# Patient Record
Sex: Female | Born: 1955 | Race: Black or African American | Hispanic: No | Marital: Married | Smoking: Never smoker
Health system: Southern US, Community
[De-identification: ages and names within clinical notes are randomized; demographics above are authoritative.]

## PROBLEM LIST (undated history)

## (undated) HISTORY — PX: OTHER SURGICAL HISTORY: SHX169

---

## 2014-05-28 ENCOUNTER — Emergency Department: Payer: Self-pay | Admitting: Emergency Medicine

## 2018-10-21 ENCOUNTER — Inpatient Hospital Stay
Admission: EM | Admit: 2018-10-21 | Discharge: 2018-10-24 | DRG: 872 | Disposition: A | Discharge status: Home / Self Care | Payer: MEDICAID | Attending: Internal Medicine | Admitting: Internal Medicine

## 2018-10-21 ENCOUNTER — Other Ambulatory Visit: Payer: Self-pay

## 2018-10-21 ENCOUNTER — Emergency Department: Payer: MEDICAID

## 2018-10-21 ENCOUNTER — Encounter: Payer: Self-pay | Admitting: Emergency Medicine

## 2018-10-21 DIAGNOSIS — E876 Hypokalemia: Secondary | ICD-10-CM | POA: Diagnosis present

## 2018-10-21 DIAGNOSIS — L0390 Cellulitis, unspecified: Secondary | ICD-10-CM | POA: Diagnosis present

## 2018-10-21 DIAGNOSIS — A419 Sepsis, unspecified organism: Principal | ICD-10-CM | POA: Diagnosis present

## 2018-10-21 DIAGNOSIS — Z20828 Contact with and (suspected) exposure to other viral communicable diseases: Secondary | ICD-10-CM | POA: Diagnosis present

## 2018-10-21 DIAGNOSIS — L03116 Cellulitis of left lower limb: Secondary | ICD-10-CM | POA: Diagnosis present

## 2018-10-21 DIAGNOSIS — E871 Hypo-osmolality and hyponatremia: Secondary | ICD-10-CM | POA: Diagnosis present

## 2018-10-21 DIAGNOSIS — R7301 Impaired fasting glucose: Secondary | ICD-10-CM | POA: Diagnosis present

## 2018-10-21 LAB — COMPREHENSIVE METABOLIC PANEL
ALT: 19 U/L (ref 0–44)
AST: 39 U/L (ref 15–41)
Albumin: 3.8 g/dL (ref 3.5–5.0)
Alkaline Phosphatase: 66 U/L (ref 38–126)
Anion gap: 10 (ref 5–15)
BUN: 14 mg/dL (ref 8–23)
CO2: 25 mmol/L (ref 22–32)
Calcium: 8.8 mg/dL — ABNORMAL LOW (ref 8.9–10.3)
Chloride: 99 mmol/L (ref 98–111)
Creatinine, Ser: 0.94 mg/dL (ref 0.44–1.00)
GFR calc Af Amer: >60 mL/min (ref >60)
GFR calc non Af Amer: >60 mL/min (ref >60)
Glucose, Bld: 144 mg/dL — ABNORMAL HIGH (ref 70–99)
Potassium: 3.3 mmol/L — ABNORMAL LOW (ref 3.5–5.1)
Sodium: 134 mmol/L — ABNORMAL LOW (ref 135–145)
Total Bilirubin: 0.7 mg/dL (ref 0.3–1.2)
Total Protein: 7.6 g/dL (ref 6.5–8.1)

## 2018-10-21 LAB — PROTIME-INR
INR: 1.3 — ABNORMAL HIGH (ref 0.8–1.2)
Prothrombin Time: 15.9 seconds — ABNORMAL HIGH (ref 11.4–15.2)

## 2018-10-21 LAB — CBC WITH DIFFERENTIAL/PLATELET
Abs Immature Granulocytes: 0.15 10*3/uL — ABNORMAL HIGH (ref 0.00–0.07)
Basophils Absolute: 0.1 10*3/uL (ref 0.0–0.1)
Basophils Relative: 1 %
Eosinophils Absolute: 0 10*3/uL (ref 0.0–0.5)
Eosinophils Relative: 0 %
HCT: 37.3 % (ref 36.0–46.0)
Hemoglobin: 12.5 g/dL (ref 12.0–15.0)
Immature Granulocytes: 1 %
Lymphocytes Relative: 6 %
Lymphs Abs: 1.2 10*3/uL (ref 0.7–4.0)
MCH: 30.6 pg (ref 26.0–34.0)
MCHC: 33.5 g/dL (ref 30.0–36.0)
MCV: 91.4 fL (ref 80.0–100.0)
Monocytes Absolute: 0.8 10*3/uL (ref 0.1–1.0)
Monocytes Relative: 4 %
Neutro Abs: 18.9 10*3/uL — ABNORMAL HIGH (ref 1.7–7.7)
Neutrophils Relative %: 88 %
Platelets: 202 10*3/uL (ref 150–400)
RBC: 4.08 MIL/uL (ref 3.87–5.11)
RDW: 13.2 % (ref 11.5–15.5)
WBC: 21.1 10*3/uL — ABNORMAL HIGH (ref 4.0–10.5)
nRBC: 0 % (ref 0.0–0.2)

## 2018-10-21 LAB — LACTIC ACID, PLASMA: Lactic Acid, Venous: 1.9 mmol/L (ref 0.5–1.9)

## 2018-10-21 MED ORDER — ENOXAPARIN SODIUM 40 MG/0.4ML ~~LOC~~ SOLN
40.0000 mg | SUBCUTANEOUS | Status: DC
  Administered 2018-10-21 – 2018-10-23 (×3): 40 mg via SUBCUTANEOUS
  Filled 2018-10-21 (×3): qty 0.4

## 2018-10-21 MED ORDER — ACETAMINOPHEN 500 MG PO TABS
1000.0000 mg | ORAL_TABLET | Freq: Once | ORAL | Status: AC
  Administered 2018-10-21: 1000 mg via ORAL
  Filled 2018-10-21: qty 2

## 2018-10-21 MED ORDER — POTASSIUM CHLORIDE CRYS ER 20 MEQ PO TBCR
40.0000 meq | EXTENDED_RELEASE_TABLET | Freq: Once | ORAL | Status: AC
  Administered 2018-10-21: 40 meq via ORAL
  Filled 2018-10-21: qty 2

## 2018-10-21 MED ORDER — ONDANSETRON HCL 4 MG PO TABS: 4.0000 mg | ORAL_TABLET | Freq: Four times a day (QID) | ORAL | Status: DC | PRN

## 2018-10-21 MED ORDER — LACTATED RINGERS IV BOLUS
1000.0000 mL | Freq: Once | INTRAVENOUS | Status: AC
  Administered 2018-10-21: 19:00:00 1000 mL via INTRAVENOUS

## 2018-10-21 MED ORDER — SODIUM CHLORIDE 0.9 % IV SOLN
INTRAVENOUS | Status: DC
  Administered 2018-10-21: 22:00:00 via INTRAVENOUS

## 2018-10-21 MED ORDER — SENNOSIDES-DOCUSATE SODIUM 8.6-50 MG PO TABS: 1.0000 | ORAL_TABLET | Freq: Every evening | ORAL | Status: DC | PRN

## 2018-10-21 MED ORDER — ACETAMINOPHEN 325 MG PO TABS
650.0000 mg | ORAL_TABLET | Freq: Four times a day (QID) | ORAL | Status: DC | PRN
  Administered 2018-10-22: 04:00:00 650 mg via ORAL
  Filled 2018-10-21: qty 2

## 2018-10-21 MED ORDER — PNEUMOCOCCAL VAC POLYVALENT 25 MCG/0.5ML IJ INJ: 0.5000 mL | INJECTION | INTRAMUSCULAR | Status: DC

## 2018-10-21 MED ORDER — VANCOMYCIN HCL 10 G IV SOLR
1250.0000 mg | INTRAVENOUS | Status: DC
  Administered 2018-10-21 – 2018-10-22 (×2): 1250 mg via INTRAVENOUS
  Filled 2018-10-21 (×3): qty 1250

## 2018-10-21 MED ORDER — SODIUM CHLORIDE 0.9 % IV SOLN
2.0000 g | Freq: Three times a day (TID) | INTRAVENOUS | Status: DC
  Administered 2018-10-21 – 2018-10-24 (×8): 2 g via INTRAVENOUS
  Filled 2018-10-21 (×9): qty 2

## 2018-10-21 MED ORDER — HYDROCODONE-ACETAMINOPHEN 5-325 MG PO TABS: 1.0000 | ORAL_TABLET | ORAL | Status: DC | PRN

## 2018-10-21 MED ORDER — ACETAMINOPHEN 650 MG RE SUPP: 650.0000 mg | Freq: Four times a day (QID) | RECTAL | Status: DC | PRN

## 2018-10-21 MED ORDER — ONDANSETRON HCL 4 MG/2ML IJ SOLN: 4.0000 mg | Freq: Four times a day (QID) | INTRAMUSCULAR | Status: DC | PRN

## 2018-10-21 MED ORDER — VANCOMYCIN HCL IN DEXTROSE 1-5 GM/200ML-% IV SOLN
1000.0000 mg | Freq: Once | INTRAVENOUS | Status: AC
  Administered 2018-10-21: 1000 mg via INTRAVENOUS
  Filled 2018-10-21: qty 200

## 2018-10-21 MED ORDER — SODIUM CHLORIDE 0.9 % IV SOLN
2.0000 g | Freq: Once | INTRAVENOUS | Status: AC
  Administered 2018-10-21: 2 g via INTRAVENOUS
  Filled 2018-10-21: qty 20

## 2018-10-21 NOTE — ED Provider Notes (Addendum)
[facility] Emergency Department Provider Note   ____________________________________________   First MD Initiated Contact with Patient 10/21/18 1635     (approximate)  I have reviewed the triage vital signs and the nursing notes.   HISTORY  Chief Complaint Leg Swelling    HPI Marissa Hayes is a 63 y.o. female with no significant past medical history who presents to the ED complaining of leg swelling and pain.  Patient reports that yesterday she developed a twinge of pain shooting down her left leg.  She then began to notice that it was increasingly red and swollen from about her knee down, and very tender to touch.  She began to feel hot and noticed a fever overnight.  She denies any injuries to her left leg, has not noticed any scratches or cuts.  She denies any history of DVT or PE, has not had any chest pain or shortness of breath.  She denies any history of diabetes and has not had issues with skin infections in the past.  She has not had any cough, chest pain, or shortness of breath.        History reviewed. No pertinent past medical history.  There are no active problems to display for this patient.   History reviewed. No pertinent surgical history.  Prior to Admission medications   Not on File    Allergies Patient has no known allergies.  History reviewed. No pertinent family history.  Social History Social History   Tobacco Use  . Smoking status: Never Smoker  . Smokeless tobacco: Never Used  Substance Use Topics  . Alcohol use: Never    Frequency: Never  . Drug use: Never    Review of Systems  Constitutional: Positive for fevers. Eyes: No visual changes. ENT: No sore throat. Cardiovascular: Denies chest pain. Respiratory: Denies shortness of breath. Gastrointestinal: No abdominal pain.  No nausea, no vomiting.  No diarrhea.  No constipation. Genitourinary: Negative for dysuria. Musculoskeletal: Negative for back pain.   Positive for left lower extremity pain and swelling Skin: Positive for red rash to left lower extremity. Neurological: Negative for headaches, focal weakness or numbness.  ____________________________________________   PHYSICAL EXAM:  VITAL SIGNS: ED Triage Vitals  Enc Vitals Group     BP 10/21/18 1616 (!) 143/58     Pulse Rate 10/21/18 1616 97     Resp --      Temp 10/21/18 1616 (!) 101.9 F (38.8 C)     Temp Source 10/21/18 1616 Oral     SpO2 10/21/18 1616 96 %     Weight 10/21/18 1611 199 lb (90.3 kg)     Height 10/21/18 1611 5\' 6"  (1.676 m)     Head Circumference --      Peak Flow --      Pain Score 10/21/18 1611 9     Pain Loc --      Pain Edu? --      Excl. in GC? --     Constitutional: Alert and oriented. Eyes: Conjunctivae are normal. Head: Atraumatic. Nose: No congestion/rhinnorhea. Mouth/Throat: Mucous membranes are moist. Neck: Normal ROM Cardiovascular: Normal rate, regular rhythm. Grossly normal heart sounds.  2+ DP pulses bilaterally. Respiratory: Normal respiratory effort.  No retractions. Lungs CTAB. Gastrointestinal: Soft and nontender. No distention. Genitourinary: deferred Musculoskeletal: Edema, erythema, and warmth extending distally from left knee with diffuse tenderness to light palpation. Neurologic:  Normal speech and language. No gross focal neurologic deficits are appreciated. Skin:  Skin  is warm, dry and intact.  Erythema throughout left lower extremity, no apparent wounds. Psychiatric: Mood and affect are normal. Speech and behavior are normal.  ____________________________________________   LABS (all labs ordered are listed, but only abnormal results are displayed)  Labs Reviewed  COMPREHENSIVE METABOLIC PANEL - Abnormal; Notable for the following components:      Result Value   Sodium 134 (*)    Potassium 3.3 (*)    Glucose, Bld 144 (*)    Calcium 8.8 (*)    All other components within normal limits  CBC WITH  DIFFERENTIAL/PLATELET - Abnormal; Notable for the following components:   WBC 21.1 (*)    Neutro Abs 18.9 (*)    Abs Immature Granulocytes 0.15 (*)    All other components within normal limits  PROTIME-INR - Abnormal; Notable for the following components:   Prothrombin Time 15.9 (*)    INR 1.3 (*)    All other components within normal limits  CULTURE, BLOOD (ROUTINE X 2)  CULTURE, BLOOD (ROUTINE X 2)  URINE CULTURE  SARS CORONAVIRUS 2  LACTIC ACID, PLASMA  URINALYSIS, ROUTINE W REFLEX MICROSCOPIC   ____________________________________________  EKG  ED ECG REPORT I, Blake Divine, the attending physician, personally viewed and interpreted this ECG.   Date: 10/21/2018  EKG Time: 17:51  Rate: 80  Rhythm: normal EKG, normal sinus rhythm, unchanged from previous tracings  Axis: Normal  Intervals:none  ST&T Change: None    PROCEDURES  Procedure(s) performed (including Critical Care):  .Critical Care Performed by: Blake Divine, MD Authorized by: Blake Divine, MD   Critical care provider statement:    Critical care time (minutes):  35   Critical care time was exclusive of:  Separately billable procedures and treating other patients and teaching time   Critical care was necessary to treat or prevent imminent or life-threatening deterioration of the following conditions:  Sepsis   Critical care was time spent personally by me on the following activities:  Discussions with consultants, evaluation of patient's response to treatment, examination of patient, ordering and performing treatments and interventions, ordering and review of laboratory studies, ordering and review of radiographic studies, pulse oximetry, re-evaluation of patient's condition, obtaining history from patient or surrogate and review of old charts   I assumed direction of critical care for this patient from another provider in my specialty: no       ____________________________________________    INITIAL IMPRESSION / ASSESSMENT AND PLAN / ED COURSE       63 year old female without significant past medical history presents to the ED complaining of increasing left lower extremity pain, swelling, and redness since yesterday.  Noted to be febrile here with no other infectious symptoms.  Differential includes sepsis, cellulitis, abscess, necrotizing fasciitis, pneumonia, UTI, DVT.  Left lower extremity swollen, erythematous, warm, and tender to palpation.  Appears most consistent with cellulitis, ultrasound negative for DVT.  Patient meets sepsis criteria given tachycardia, fever, and leukocytosis.  Will treat with Rocephin and vancomycin, fluid resuscitation initiated.  Case discussed with hospitalist, who accepts patient for admission.      ____________________________________________   FINAL CLINICAL IMPRESSION(S) / ED DIAGNOSES  Final diagnoses:  Sepsis without acute organ dysfunction, due to unspecified organism [facility]  Cellulitis of left lower extremity     ED Discharge Orders    None       Note:  This document was prepared using Dragon voice recognition software and may include unintentional dictation errors.   Blake Divine, MD 10/21/18 [phone]  Chesley NoonJessup, Clyde Upshaw, MD 11/03/18 1026

## 2018-10-21 NOTE — Consult Note (Signed)
Pharmacy Antibiotic Note  Marissa Hayes is a 63 y.o. female admitted on 10/21/2018 with sepsis.  Pharmacy has been consulted for cefepime and vancomycin dosing.  Plan: Cefepime 2 g q8H   Pt received vancomycin 1000 mg x 1 in the ED. Will give an addition vancomycin 1250 mg x 1 for a total loading dose of 2250 mg. Will order a maintenance dose of 1250 mg q24H. Predicted AUC is 545. Goal AUC is 400-550. Scr 0.94. Css min 12.5. Plan to order levels in 4-5 days.   Height: 5\' 6"  (167.6 cm) Weight: 199 lb (90.3 kg) IBW/kg (Calculated) : 59.3  Temp (24hrs), Avg:101.9 F (38.8 C), Min:101.9 F (38.8 C), Max:101.9 F (38.8 C)  Recent Labs  Lab 10/21/18 1615 10/21/18 1621  WBC  --  21.1*  CREATININE  --  0.94  LATICACIDVEN 1.9  --     Estimated Creatinine Clearance: 67.5 mL/min (by C-G formula based on SCr of 0.94 mg/dL).    No Known Allergies  Antimicrobials this admission: 8/5 cefepime >>  8/5 vancomycin >>   Dose adjustments this admission: None  Microbiology results: 8/5 BCx: pending   Thank you for allowing pharmacy to be a part of this patient's care.  Oswald Hillock 10/21/2018 7:19 PM

## 2018-10-21 NOTE — ED Notes (Signed)
ED TO INPATIENT HANDOFF REPORT  ED Nurse Name and Phone #:  [phone] Ronni RumbleAlicia  S Name/Age/Gender Marissa Hayes 63 y.o. female Room/Bed: ED13A/ED13A  Code Status   Code Status: Not on file  Home/SNF/Other Home Patient oriented to: self, place, time and situation Is this baseline? Yes   Triage Complete: Triage complete  Chief Complaint leg pain  Triage Note Pt here for LLE pain/swelling/redness.  She reports it started yesterday. Leg is hot to touch.  Appears like a cellulitis.  Unlabored. Redness and swelling go from foot to knee.  Febrile in triage.     Allergies No Known Allergies  Level of Care/Admitting Diagnosis ED Disposition    ED Disposition Condition Comment   Admit  [facility]ospital [facility]: [facility] [[id number]]  Level of Care: Med-Surg [16]  Covid Evaluation: Asymptomatic Screening Protocol (No Symptoms)  Diagnosis: Cellulitis [[id number]][[id number]]  Admitting Physician: Ihor AustinPYREDDY, PAVAN [[id number]]  Attending Physician: Ihor AustinPYREDDY, PAVAN [[id number]]  Estimated length of stay: past midnight tomorrow  Certification:: I certify this patient will need inpatient services for at least 2 midnights  PT Class (Do Not Modify): Inpatient [101]  PT Acc Code (Do Not Modify): Private [1]       B Medical/Surgery History History reviewed. No pertinent past medical history. Past Surgical History:  Procedure Laterality Date  . none       A IV Location/Drains/Wounds Patient Lines/Drains/Airways Status   Active Line/Drains/Airways    Name:   Placement date:   Placement time:   Site:   Days:   Peripheral IV 10/21/18 Right Antecubital   10/21/18    1615    Antecubital   less than 1          Intake/Output Last 24 hours  Intake/Output Summary (Last 24 hours) at 10/21/2018 2011 Last data filed at 10/21/2018 1936 Gross per 24 hour  Intake 200 ml  Output -  Net 200 ml    Labs/Imaging Results for orders placed or performed during the hospital encounter of 10/21/18 (from the past 48  hour(s))  Lactic acid, plasma     Status: None   Collection Time: 10/21/18  4:15 PM  Result Value Ref Range   Lactic Acid, Venous 1.9 0.5 - 1.9 mmol/L    Comment: Performed at [facility], [address] [place], [place] [address]  Comprehensive metabolic panel     Status: Abnormal   Collection Time: 10/21/18  4:21 PM  Result Value Ref Range   Sodium 134 (L) 135 - 145 mmol/L   Potassium 3.3 (L) 3.5 - 5.1 mmol/L   Chloride 99 98 - 111 mmol/L   CO2 25 22 - 32 mmol/L   Glucose, Bld 144 (H) 70 - 99 mg/dL   BUN 14 8 - 23 mg/dL   Creatinine, Ser 0.860.94 0.44 - 1.00 mg/dL   Calcium 8.8 (L) 8.9 - 10.3 mg/dL   Total Protein 7.6 6.5 - 8.1 g/dL   Albumin 3.8 3.5 - 5.0 g/dL   AST 39 15 - 41 U/L   ALT 19 0 - 44 U/L   Alkaline Phosphatase 66 38 - 126 U/L   Total Bilirubin 0.7 0.3 - 1.2 mg/dL   GFR calc non Af Amer >60 >60 mL/min   GFR calc Af Amer >60 >60 mL/min   Anion gap 10 5 - 15    Comment: Performed at [facility], [address] [place], [place] [address]  CBC with Differential     Status: Abnormal   Collection  Time: 10/21/18  4:21 PM  Result Value Ref Range   WBC 21.1 (H) 4.0 - 10.5 K/uL   RBC 4.08 3.87 - 5.11 MIL/uL   Hemoglobin 12.5 12.0 - 15.0 g/dL   HCT 57.837.3 46.936.0 - 62.946.0 %   MCV 91.4 80.0 - 100.0 fL   MCH 30.6 26.0 - 34.0 pg   MCHC 33.5 30.0 - 36.0 g/dL   RDW 52.813.2 41.311.5 - 24.415.5 %   Platelets 202 150 - 400 K/uL   nRBC 0.0 0.0 - 0.2 %   Neutrophils Relative % 88 %   Neutro Abs 18.9 (H) 1.7 - 7.7 K/uL   Lymphocytes Relative 6 %   Lymphs Abs 1.2 0.7 - 4.0 K/uL   Monocytes Relative 4 %   Monocytes Absolute 0.8 0.1 - 1.0 K/uL   Eosinophils Relative 0 %   Eosinophils Absolute 0.0 0.0 - 0.5 K/uL   Basophils Relative 1 %   Basophils Absolute 0.1 0.0 - 0.1 K/uL   Immature Granulocytes 1 %   Abs Immature Granulocytes 0.15 (H) 0.00 - 0.07 K/uL    Comment: Performed at [facility], [address] [place], [place] [address]  Protime-INR     Status:  Abnormal   Collection Time: 10/21/18  4:21 PM  Result Value Ref Range   Prothrombin Time 15.9 (H) 11.4 - 15.2 seconds   INR 1.3 (H) 0.8 - 1.2    Comment: (NOTE) INR goal varies based on device and disease states. Performed at [facility], [address] [place], [place] [address]    [place] Venous Img Lower Unilateral Left  Result Date: 10/21/2018 CLINICAL DATA:  Left lower extremity pain and edema. EXAM: LEFT LOWER EXTREMITY VENOUS DOPPLER ULTRASOUND TECHNIQUE: Gray-scale sonography with graded compression, as well as color Doppler and duplex ultrasound were performed to evaluate the lower extremity deep venous systems from the level of the common femoral vein and including the common femoral, femoral, profunda femoral, popliteal and calf veins including the posterior tibial, peroneal and gastrocnemius veins when visible. The superficial great saphenous vein was also interrogated. Spectral Doppler was utilized to evaluate flow at rest and with distal augmentation maneuvers in the common femoral, femoral and popliteal veins. COMPARISON:  None. FINDINGS: Contralateral Common Femoral Vein: Respiratory phasicity is normal and symmetric with the symptomatic side. No evidence of thrombus. Normal compressibility. Common Femoral Vein: No evidence of thrombus. Normal compressibility, respiratory phasicity and response to augmentation. Saphenofemoral Junction: No evidence of thrombus. Normal compressibility and flow on color Doppler imaging. Profunda Femoral Vein: No evidence of thrombus. Normal compressibility and flow on color Doppler imaging. Femoral Vein: No evidence of thrombus. Normal compressibility, respiratory phasicity and response to augmentation. Popliteal Vein: No evidence of thrombus. Normal compressibility, respiratory phasicity and response to augmentation. Calf Veins: No evidence of thrombus. Normal compressibility and flow on color Doppler imaging. Superficial Great Saphenous Vein: No  evidence of thrombus. Normal compressibility. Venous Reflux:  None. Other Findings: Small fluid collection in the left popliteal fossa measures approximately 4.5 x 1.1 x 2.5 cm and is consistent with a Baker's cyst. No evidence of superficial thrombophlebitis. IMPRESSION: No evidence of left lower extremity deep venous thrombosis. Left popliteal fossa Baker's cyst present. Electronically Signed   By: Irish LackGlenn  Yamagata M.D.   On: 10/21/2018 18:59    Pending Labs Unresulted Labs (From admission, onward)    Start     Ordered   10/21/18 1805  SARS CORONAVIRUS 2 Nasal Swab Aptima Multi Swab  (Asymptomatic/Tier 2  Patients Labs)  Once,   STAT    Question Answer Comment  Is this test for diagnosis or screening Screening   Symptomatic for COVID-19 as defined by CDC No   Hospitalized for COVID-19 No   Admitted to ICU for COVID-19 No   Previously tested for COVID-19 No   Resident in a congregate (group) care setting No   Employed in healthcare setting No   Pregnant No      10/21/18 1805   10/21/18 1702  Urinalysis, Routine w reflex microscopic  ONCE - STAT,   STAT     10/21/18 1703   10/21/18 1702  Urine culture  ONCE - STAT,   STAT     10/21/18 1703   10/21/18 1619  Culture, blood (Routine x 2)  BLOOD CULTURE X 2,   STAT     10/21/18 1618   Signed and Held  HIV antibody (Routine Testing)  Once,   R     Signed and Held   Signed and Held  CBC  (enoxaparin (LOVENOX)    CrCl >/= 30 ml/min)  Once,   R    Comments: Baseline for enoxaparin therapy IF NOT ALREADY DRAWN.  Notify MD if PLT < 100 K.    Signed and Held   Signed and Held  Creatinine, serum  (enoxaparin (LOVENOX)    CrCl >/= 30 ml/min)  Once,   R    Comments: Baseline for enoxaparin therapy IF NOT ALREADY DRAWN.    Signed and Held   Signed and Held  Creatinine, serum  (enoxaparin (LOVENOX)    CrCl >/= 30 ml/min)  Weekly,   R    Comments: while on enoxaparin therapy    Signed and Held   Signed and Held  Basic metabolic panel  Tomorrow  morning,   R     Signed and Held   Signed and Held  CBC  Tomorrow morning,   R     Signed and Held          Vitals/Pain Today's Vitals   10/21/18 1930 10/21/18 2000 10/21/18 2010 10/21/18 2010  BP: 129/61 (!) 120/52    Pulse: 80 80    Resp: (!) 23 (!) 24    Temp:   99.4 F (37.4 C)   TempSrc:   Oral   SpO2: 96% 95%    Weight:      Height:      PainSc:    0-No pain    Isolation Precautions No active isolations  Medications Medications  potassium chloride SA (K-DUR) CR tablet 40 mEq (has no administration in time range)  vancomycin (VANCOCIN) 1,250 mg in sodium chloride 0.9 % 250 mL IVPB (has no administration in time range)  ceFEPIme (MAXIPIME) 2 g in sodium chloride 0.9 % 100 mL IVPB (has no administration in time range)  vancomycin (VANCOCIN) IVPB 1000 mg/200 mL premix (0 mg Intravenous Stopped 10/21/18 1936)  cefTRIAXone (ROCEPHIN) 2 g in sodium chloride 0.9 % 100 mL IVPB (0 g Intravenous Stopped 10/21/18 1822)  lactated ringers bolus 1,000 mL (1,000 mLs Intravenous New Bag/Given 10/21/18 1836)  acetaminophen (TYLENOL) tablet 1,000 mg (1,000 mg Oral Given 10/21/18 1745)    Mobility walks Low fall risk   Focused Assessments Cardiac Assessment Handoff:    No results found for: CKTOTAL, CKMB, CKMBINDEX, TROPONINI No results found for: DDIMER Does the Patient currently have chest pain? No   , Pulmonary Assessment Handoff:  Lung sounds:   clear bil O2 Device: Room Air  Redness, swelling, warmth noted to LLE.      R Recommendations: See Admitting Provider Note  Report given to:   Additional Notes:

## 2018-10-21 NOTE — H&P (Signed)
[facility] [place] NAME: Virginia Curl    MR#:  [id number]  DATE OF BIRTH:  [birth year]  DATE OF ADMISSION:  10/21/2018  PRIMARY CARE PHYSICIAN: Patient, No Pcp Per   REQUESTING/REFERRING PHYSICIAN:   CHIEF COMPLAINT:   Chief Complaint  Patient presents with  . Leg Swelling    HISTORY OF PRESENT ILLNESS: Brindle Leyba  is a 63 y.o. female with no significant past medical history presented to the emergency room with left leg swelling and redness for the last 1 day.  There is pain in the left leg.  Patient also some itching and redness in the left leg.  Swelling of the left leg also noted.  Venous Doppler ultrasound did not reveal any DVT.  Laboratory work-up showed elevated WBC count patient has cellulitis of the left leg and antibiotics were started.  PAST MEDICAL HISTORY: No history of COPD diabetes and heart disease  PAST SURGICAL HISTORY:  Past Surgical History:  Procedure Laterality Date  . none      SOCIAL HISTORY:  Social History   Tobacco Use  . Smoking status: Never Smoker  . Smokeless tobacco: Never Used  Substance Use Topics  . Alcohol use: Never    Frequency: Never    FAMILY HISTORY: No history of COPD diabetes cancer in the family  DRUG ALLERGIES: No Known Allergies  REVIEW OF SYSTEMS:   CONSTITUTIONAL: No fever, fatigue or weakness.  EYES: No blurred or double vision.  EARS, NOSE, AND THROAT: No tinnitus or ear pain.  RESPIRATORY: No cough, shortness of breath, wheezing or hemoptysis.  CARDIOVASCULAR: No chest pain, orthopnea, edema.  GASTROINTESTINAL: No nausea, vomiting, diarrhea or abdominal pain.  GENITOURINARY: No dysuria, hematuria.  ENDOCRINE: No polyuria, nocturia,  HEMATOLOGY: No anemia, easy bruising or bleeding SKIN : Red discoloration of the skin of the left leg MUSCULOSKELETAL: Swelling of the left leg Redness of the left leg NEUROLOGIC: No tingling, numbness, weakness.  PSYCHIATRY: No anxiety  or depression.   MEDICATIONS AT HOME:  Prior to Admission medications   Not on File      PHYSICAL EXAMINATION:   VITAL SIGNS: Blood pressure (!) 120/97, pulse 78, temperature (!) 101.9 F (38.8 C), temperature source Oral, resp. rate (!) 21, height 5\' 6"  (1.676 m), weight 90.3 kg, SpO2 97 %.  GENERAL:  63 y.o.-year-old patient lying in the bed with no acute distress.  EYES: Pupils equal, round, reactive to light and accommodation. No scleral icterus. Extraocular muscles intact.  HEENT: Head atraumatic, normocephalic. Oropharynx and nasopharynx clear.  NECK:  Supple, no jugular venous distention. No thyroid enlargement, no tenderness.  LUNGS: Normal breath sounds bilaterally, no wheezing, rales,rhonchi or crepitation. No use of accessory muscles of respiration.  CARDIOVASCULAR: S1, S2 normal. No murmurs, rubs, or gallops.  ABDOMEN: Soft, nontender, nondistended. Bowel sounds present. No organomegaly or mass.  EXTREMITIES: No  cyanosis, or clubbing.  Swelling of the left leg noted NEUROLOGIC: Cranial nerves II through XII are intact. Muscle strength 5/5 in all extremities. Sensation intact. Gait not checked.  PSYCHIATRIC: The patient is alert and oriented x 3.  SKIN: Red discoloration of the skin of the left leg      LABORATORY PANEL:   CBC Recent Labs  Lab 10/21/18 1621  WBC 21.1*  HGB 12.5  HCT 37.3  PLT 202  MCV 91.4  MCH 30.6  MCHC 33.5  RDW 13.2  LYMPHSABS 1.2  MONOABS 0.8  EOSABS 0.0  BASOSABS 0.1   ------------------------------------------------------------------------------------------------------------------  Chemistries  Recent Labs  Lab 10/21/18 1621  NA 134*  K 3.3*  CL 99  CO2 25  GLUCOSE 144*  BUN 14  CREATININE 0.94  CALCIUM 8.8*  AST 39  ALT 19  ALKPHOS 66  BILITOT 0.7   ------------------------------------------------------------------------------------------------------------------ estimated creatinine clearance is 67.5 mL/min (by  C-G formula based on SCr of 0.94 mg/dL). ------------------------------------------------------------------------------------------------------------------ No results for input(s): TSH, T4TOTAL, T3FREE, THYROIDAB in the last 72 hours.  Invalid input(s): FREET3   Coagulation profile Recent Labs  Lab 10/21/18 1621  INR 1.3*   ------------------------------------------------------------------------------------------------------------------- No results for input(s): DDIMER in the last 72 hours. -------------------------------------------------------------------------------------------------------------------  Cardiac Enzymes No results for input(s): CKMB, TROPONINI, MYOGLOBIN in the last 168 hours.  Invalid input(s): CK ------------------------------------------------------------------------------------------------------------------ Invalid input(s): POCBNP  ---------------------------------------------------------------------------------------------------------------  Urinalysis No results found for: COLORURINE, APPEARANCEUR, LABSPEC, PHURINE, GLUCOSEU, HGBUR, BILIRUBINUR, KETONESUR, PROTEINUR, UROBILINOGEN, NITRITE, LEUKOCYTESUR   RADIOLOGY: Koreas Venous Img Lower Unilateral Left  Result Date: 10/21/2018 CLINICAL DATA:  Left lower extremity pain and edema. EXAM: LEFT LOWER EXTREMITY VENOUS DOPPLER ULTRASOUND TECHNIQUE: Gray-scale sonography with graded compression, as well as color Doppler and duplex ultrasound were performed to evaluate the lower extremity deep venous systems from the level of the common femoral vein and including the common femoral, femoral, profunda femoral, popliteal and calf veins including the posterior tibial, peroneal and gastrocnemius veins when visible. The superficial great saphenous vein was also interrogated. Spectral Doppler was utilized to evaluate flow at rest and with distal augmentation maneuvers in the common femoral, femoral and popliteal veins.  COMPARISON:  None. FINDINGS: Contralateral Common Femoral Vein: Respiratory phasicity is normal and symmetric with the symptomatic side. No evidence of thrombus. Normal compressibility. Common Femoral Vein: No evidence of thrombus. Normal compressibility, respiratory phasicity and response to augmentation. Saphenofemoral Junction: No evidence of thrombus. Normal compressibility and flow on color Doppler imaging. Profunda Femoral Vein: No evidence of thrombus. Normal compressibility and flow on color Doppler imaging. Femoral Vein: No evidence of thrombus. Normal compressibility, respiratory phasicity and response to augmentation. Popliteal Vein: No evidence of thrombus. Normal compressibility, respiratory phasicity and response to augmentation. Calf Veins: No evidence of thrombus. Normal compressibility and flow on color Doppler imaging. Superficial Great Saphenous Vein: No evidence of thrombus. Normal compressibility. Venous Reflux:  None. Other Findings: Small fluid collection in the left popliteal fossa measures approximately 4.5 x 1.1 x 2.5 cm and is consistent with a Baker's cyst. No evidence of superficial thrombophlebitis. IMPRESSION: No evidence of left lower extremity deep venous thrombosis. Left popliteal fossa Baker's cyst present. Electronically Signed   By: Irish LackGlenn  Yamagata M.D.   On: 10/21/2018 18:59    EKG: Orders placed or performed during the hospital encounter of 10/21/18  . ED EKG 12-Lead  . ED EKG 12-Lead  . EKG 12-Lead  . EKG 12-Lead    IMPRESSION AND PLAN: 63 year old female patient with no significant past medical history presented to the emergency room with left leg swelling and redness for the last 1 day.  There is pain in the left leg.  -Left leg cellulitis IV antibiotic therapy Follow-up cultures  -Sepsis secondary to cellulitis of the leg Start broad-spectrum antibiotics initially Taper antibiotics once cultures are follow-up and WBC count stone  -Acute hypokalemia  Replace potassium  -Acute hyponatremia IV fluids  -DVT prophylaxis subcu Lovenox daily  All the records are reviewed and case discussed with ED provider. Management plans discussed with the patient, family and they are in agreement.  CODE STATUS: Full code  TOTAL TIME  TAKING CARE OF THIS PATIENT: 52 minutes.    Ihor AustinPavan Pyreddy M.D on 10/21/2018 at 7:40 PM  Between 7am to 6pm - Pager - [phone]  After 6pm go to [email or web address] - password [email or web address]  [facility]  Office  [phone]  CC: Primary care physician; Patient, No Pcp Per

## 2018-10-21 NOTE — Consult Note (Signed)
CODE SEPSIS - PHARMACY COMMUNICATION  **Broad Spectrum Antibiotics should be administered within 1 hour of Sepsis diagnosis**  Time Code Sepsis Called/Page Received: 1703  Antibiotics Ordered: ceftriaxone and vancomycin  Time of 1st antibiotic administration: [place] Evonne Rinks ,PharmD Clinical Pharmacist  10/21/2018  5:53 PM

## 2018-10-21 NOTE — Progress Notes (Signed)
Advanced care plan. Purpose of the Encounter: CODE STATUS Parties in Attendance: Patient Patient's Decision Capacity: Good Subjective/Patient's story: Marissa Hayes  is a 63 y.o. female with no significant past medical history presented to the emergency room with left leg swelling and redness for the last 1 day.  There is pain in the left leg.  Patient also some itching and redness in the left leg.  Objective/Medical story Patient has cellulitis of the left leg Venous Doppler ultrasound showed no DVT Needs IV antibiotic therapy Goals of care determination:  Advance care directives goals of care treatment plan discussed Patient wants everything done which includes CPR, intubation ventilator if the need arises CODE STATUS: Full code Time spent discussing advanced care planning: 16 minutes

## 2018-10-21 NOTE — ED Triage Notes (Addendum)
Pt here for LLE pain/swelling/redness.  She reports it started yesterday. Leg is hot to touch.  Appears like a cellulitis.  Unlabored. Redness and swelling go from foot to knee.  Febrile in triage.

## 2018-10-21 NOTE — Consult Note (Signed)
PHARMACY -  BRIEF ANTIBIOTIC NOTE   Pharmacy has received consult(s) for cellulitis from an ED provider.  The patient's profile has been reviewed for ht/wt/allergies/indication/available labs.    One time order(s) placed for vancomycin  Further antibiotics/pharmacy consults should be ordered by admitting physician if indicated.                       Thank you, Oswald Hillock 10/21/2018  5:04 PM

## 2018-10-22 LAB — URINALYSIS, ROUTINE W REFLEX MICROSCOPIC
Bilirubin Urine: NEGATIVE
Glucose, UA: NEGATIVE mg/dL
Hgb urine dipstick: NEGATIVE
Ketones, ur: 5 mg/dL — AB
Nitrite: NEGATIVE
Protein, ur: NEGATIVE mg/dL
Specific Gravity, Urine: 1.016 (ref 1.005–1.030)
pH: 6 (ref 5.0–8.0)

## 2018-10-22 LAB — BASIC METABOLIC PANEL
Anion gap: 7 (ref 5–15)
BUN: 11 mg/dL (ref 8–23)
CO2: 26 mmol/L (ref 22–32)
Calcium: 8.3 mg/dL — ABNORMAL LOW (ref 8.9–10.3)
Chloride: 105 mmol/L (ref 98–111)
Creatinine, Ser: 0.98 mg/dL (ref 0.44–1.00)
GFR calc Af Amer: >60 mL/min (ref >60)
GFR calc non Af Amer: >60 mL/min (ref >60)
Glucose, Bld: 138 mg/dL — ABNORMAL HIGH (ref 70–99)
Potassium: 3.6 mmol/L (ref 3.5–5.1)
Sodium: 138 mmol/L (ref 135–145)

## 2018-10-22 LAB — CBC
HCT: 34.3 % — ABNORMAL LOW (ref 36.0–46.0)
Hemoglobin: 11.2 g/dL — ABNORMAL LOW (ref 12.0–15.0)
MCH: 30.6 pg (ref 26.0–34.0)
MCHC: 32.7 g/dL (ref 30.0–36.0)
MCV: 93.7 fL (ref 80.0–100.0)
Platelets: 173 10*3/uL (ref 150–400)
RBC: 3.66 MIL/uL — ABNORMAL LOW (ref 3.87–5.11)
RDW: 13.3 % (ref 11.5–15.5)
WBC: 16.8 10*3/uL — ABNORMAL HIGH (ref 4.0–10.5)
nRBC: 0 % (ref 0.0–0.2)

## 2018-10-22 LAB — HEMOGLOBIN A1C
Hgb A1c MFr Bld: 5.6 % (ref 4.8–5.6)
Mean Plasma Glucose: 114.02 mg/dL

## 2018-10-22 LAB — SARS CORONAVIRUS 2 (TAT 6-24 HRS): SARS Coronavirus 2: NEGATIVE

## 2018-10-22 MED ORDER — SODIUM CHLORIDE 0.9 % IV SOLN
INTRAVENOUS | Status: DC | PRN
  Administered 2018-10-22 – 2018-10-24 (×5): 250 mL via INTRAVENOUS

## 2018-10-22 NOTE — Progress Notes (Signed)
Patient ID: Marissa Hayes, female   DOB: [birth year], 63 y.o.   MRN: [id number]  [employer] PROGRESS NOTE  RENISHA COCKRUM EVO:[id number] DOB: 07/08/1955 DOA: 10/21/2018 PCP: Patient, No Pcp Per  HPI/Subjective: Patient states that she had abdominal pain yesterday and some shaking chills.  No energy.  Then her leg swelled and got red.  She thought it started in her left groin but is mostly down in her foot and leg.  Objective: Vitals:   10/22/18 0352 10/22/18 0600  BP: 119/61   Pulse: 89   Resp:    Temp: (!) 100.8 F (38.2 C) 99.7 F (37.6 C)  SpO2: 99%     Filed Weights   10/21/18 1611  Weight: 90.3 kg    ROS: Review of Systems  Constitutional: Negative for chills and fever.  Eyes: Negative for blurred vision.  Respiratory: Negative for cough and shortness of breath.   Cardiovascular: Negative for chest pain.  Gastrointestinal: Negative for constipation, diarrhea, nausea and vomiting.  Genitourinary: Negative for dysuria.  Musculoskeletal: Positive for joint pain.  Neurological: Negative for dizziness and headaches.   Exam: Physical Exam  Constitutional: She is oriented to person, place, and time.  HENT:  Nose: No mucosal edema.  Mouth/Throat: No oropharyngeal exudate or posterior oropharyngeal edema.  Eyes: Pupils are equal, round, and reactive to light. Conjunctivae, EOM and lids are normal.  Neck: No JVD present. Carotid bruit is not present. No edema present. No thyroid mass and no thyromegaly present.  Cardiovascular: S1 normal and S2 normal. Exam reveals no gallop.  No murmur heard. Pulses:      Dorsalis pedis pulses are 2+ on the right side and 2+ on the left side.  Respiratory: No respiratory distress. She has no wheezes. She has no rhonchi. She has no rales.  GI: Soft. Bowel sounds are normal. There is no abdominal tenderness.  Musculoskeletal:     Left ankle: She exhibits swelling.  Lymphadenopathy:    She has no cervical adenopathy.  Neurological: She is  alert and oriented to person, place, and time. No cranial nerve deficit.  Skin: Skin is warm. Nails show no clubbing.  Erythema left leg and foot.  Pain to palpation.  Warm to palpation.  Psychiatric: She has a normal mood and affect.      Data Reviewed: Basic Metabolic Panel: Recent Labs  Lab 10/21/18 1621 10/22/18 0527  NA 134* 138  K 3.3* 3.6  CL 99 105  CO2 25 26  GLUCOSE 144* 138*  BUN 14 11  CREATININE 0.94 0.98  CALCIUM 8.8* 8.3*   Liver Function Tests: Recent Labs  Lab 10/21/18 1621  AST 39  ALT 19  ALKPHOS 66  BILITOT 0.7  PROT 7.6  ALBUMIN 3.8   CBC: Recent Labs  Lab 10/21/18 1621 10/22/18 0527  WBC 21.1* 16.8*  NEUTROABS 18.9*  --   HGB 12.5 11.2*  HCT 37.3 34.3*  MCV 91.4 93.7  PLT 202 173     Recent Results (from the past 240 hour(s))  Culture, blood (Routine x 2)     Status: None (Preliminary result)   Collection Time: 10/21/18  4:21 PM   Specimen: BLOOD  Result Value Ref Range Status   Specimen Description BLOOD LEFT ANTECUBITAL  Final   Special Requests   Final    BOTTLES DRAWN AEROBIC AND ANAEROBIC Blood Culture results may not be optimal due to an excessive volume of blood received in culture bottles   Culture   Final  NO GROWTH < 24 HOURS Performed at [facility], [address] [place], [place] [address]    Report Status PENDING  Incomplete  Culture, blood (Routine x 2)     Status: None (Preliminary result)   Collection Time: 10/21/18  4:24 PM   Specimen: BLOOD  Result Value Ref Range Status   Specimen Description BLOOD RIGHT ANTECUBITAL  Final   Special Requests   Final    BOTTLES DRAWN AEROBIC AND ANAEROBIC Blood Culture adequate volume   Culture   Final    NO GROWTH < 24 HOURS Performed at [facility], [address] [place], [place] [address]    Report Status PENDING  Incomplete  SARS CORONAVIRUS 2 Nasal Swab Aptima Multi Swab     Status: None   Collection Time: 10/21/18  7:49 PM   Specimen:  Aptima Multi Swab; Nasal Swab  Result Value Ref Range Status   SARS Coronavirus 2 NEGATIVE NEGATIVE Final    Comment: (NOTE) SARS-CoV-2 target nucleic acids are NOT DETECTED. The SARS-CoV-2 RNA is generally detectable in upper and lower respiratory specimens during the acute phase of infection. Negative results do not preclude SARS-CoV-2 infection, do not rule out co-infections with other pathogens, and should not be used as the sole basis for treatment or other patient management decisions. Negative results must be combined with clinical observations, patient history, and epidemiological information. The expected result is Negative. Fact Sheet for Patients: HairSlick.nohttps://www.fda.gov/media/138098/download Fact Sheet for Healthcare Providers: quierodirigir.comhttps://www.fda.gov/media/138095/download This test is not yet approved or cleared by the [place] FDA and  has been authorized for detection and/or diagnosis of SARS-CoV-2 by FDA under an Emergency Use Authorization (EUA). This EUA will remain  in effect (meaning this test can be used) for the duration of the COVID-19 declaration under Section 56 4(b)(1) of the Act, 21 U.S.C. section 360bbb-3(b)(1), unless the authorization is terminated or revoked sooner. Performed at [facility], [address] [place], [place] [address]      Studies: Koreas Venous Img Lower Unilateral Left  Result Date: 10/21/2018 CLINICAL DATA:  Left lower extremity pain and edema. EXAM: LEFT LOWER EXTREMITY VENOUS DOPPLER ULTRASOUND TECHNIQUE: Gray-scale sonography with graded compression, as well as color Doppler and duplex ultrasound were performed to evaluate the lower extremity deep venous systems from the level of the common femoral vein and including the common femoral, femoral, profunda femoral, popliteal and calf veins including the posterior tibial, peroneal and gastrocnemius veins when visible. The superficial great saphenous vein was also interrogated. Spectral  Doppler was utilized to evaluate flow at rest and with distal augmentation maneuvers in the common femoral, femoral and popliteal veins. COMPARISON:  None. FINDINGS: Contralateral Common Femoral Vein: Respiratory phasicity is normal and symmetric with the symptomatic side. No evidence of thrombus. Normal compressibility. Common Femoral Vein: No evidence of thrombus. Normal compressibility, respiratory phasicity and response to augmentation. Saphenofemoral Junction: No evidence of thrombus. Normal compressibility and flow on color Doppler imaging. Profunda Femoral Vein: No evidence of thrombus. Normal compressibility and flow on color Doppler imaging. Femoral Vein: No evidence of thrombus. Normal compressibility, respiratory phasicity and response to augmentation. Popliteal Vein: No evidence of thrombus. Normal compressibility, respiratory phasicity and response to augmentation. Calf Veins: No evidence of thrombus. Normal compressibility and flow on color Doppler imaging. Superficial Great Saphenous Vein: No evidence of thrombus. Normal compressibility. Venous Reflux:  None. Other Findings: Small fluid collection in the left popliteal fossa measures approximately 4.5 x 1.1 x 2.5 cm and is  consistent with a Baker's cyst. No evidence of superficial thrombophlebitis. IMPRESSION: No evidence of left lower extremity deep venous thrombosis. Left popliteal fossa Baker's cyst present. Electronically Signed   By: Irish LackGlenn  Yamagata M.D.   On: 10/21/2018 18:59    Scheduled Meds: . enoxaparin (LOVENOX) injection  40 mg Subcutaneous Q24H  . pneumococcal 23 valent vaccine  0.5 mL Intramuscular Tomorrow-1000   Continuous Infusions: . ceFEPime (MAXIPIME) IV Stopped (10/22/18 0703)  . vancomycin 1,250 mg (10/21/18 2221)    Assessment/Plan:  1. Clinical sepsis with left lower extremity cellulitis, fever and leukocytosis.  Patient given vancomycin and cefepime aggressive antibiotics.  White blood cell count trending better  down from 21.1 to 16.8. 2. Impaired fasting glucose send off a hemoglobin A1c 3. Hypokalemia replaced. 4. Hyponatremia improved with IV fluids.  Code Status:     Code Status Orders  (From admission, onward)         Start     Ordered   10/21/18 2059  Full code  Continuous     10/21/18 2058        Code Status History    This patient has a current code status but no historical code status.   Advance Care Planning Activity     Family Communication: Spoke with son at the bedside Disposition Plan: To be determined  Antibiotics:  Vancomycin  Cefepime  Time spent: 28 minutes  Emiel Kielty [employer]  [employer]

## 2018-10-22 NOTE — Progress Notes (Deleted)
Pt's ride present at the [facility] entrance; pt discharged via wheelchair by nursing to the [facility] entrance 

## 2018-10-23 ENCOUNTER — Inpatient Hospital Stay: Payer: MEDICAID

## 2018-10-23 ENCOUNTER — Encounter: Payer: Self-pay | Admitting: Radiology

## 2018-10-23 LAB — BASIC METABOLIC PANEL
Anion gap: 7 (ref 5–15)
BUN: 9 mg/dL (ref 8–23)
CO2: 25 mmol/L (ref 22–32)
Calcium: 8.5 mg/dL — ABNORMAL LOW (ref 8.9–10.3)
Chloride: 105 mmol/L (ref 98–111)
Creatinine, Ser: 0.86 mg/dL (ref 0.44–1.00)
GFR calc Af Amer: >60 mL/min (ref >60)
GFR calc non Af Amer: >60 mL/min (ref >60)
Glucose, Bld: 118 mg/dL — ABNORMAL HIGH (ref 70–99)
Potassium: 3.5 mmol/L (ref 3.5–5.1)
Sodium: 137 mmol/L (ref 135–145)

## 2018-10-23 LAB — CBC
HCT: 33.5 % — ABNORMAL LOW (ref 36.0–46.0)
Hemoglobin: 10.9 g/dL — ABNORMAL LOW (ref 12.0–15.0)
MCH: 30.1 pg (ref 26.0–34.0)
MCHC: 32.5 g/dL (ref 30.0–36.0)
MCV: 92.5 fL (ref 80.0–100.0)
Platelets: 162 10*3/uL (ref 150–400)
RBC: 3.62 MIL/uL — ABNORMAL LOW (ref 3.87–5.11)
RDW: 13.3 % (ref 11.5–15.5)
WBC: 12.7 10*3/uL — ABNORMAL HIGH (ref 4.0–10.5)
nRBC: 0 % (ref 0.0–0.2)

## 2018-10-23 LAB — URINE CULTURE: Culture: NO GROWTH

## 2018-10-23 LAB — HIV ANTIBODY (ROUTINE TESTING W REFLEX): HIV Screen 4th Generation wRfx: NONREACTIVE

## 2018-10-23 MED ORDER — VANCOMYCIN HCL 1.25 G IV SOLR
1250.0000 mg | INTRAVENOUS | Status: DC
  Administered 2018-10-23: 1250 mg via INTRAVENOUS
  Filled 2018-10-23: qty 1250

## 2018-10-23 MED ORDER — IOHEXOL 300 MG/ML  SOLN
100.0000 mL | Freq: Once | INTRAMUSCULAR | Status: AC | PRN
  Administered 2018-10-23: 11:00:00 100 mL via INTRAVENOUS

## 2018-10-23 NOTE — Consult Note (Signed)
Pharmacy Antibiotic Note  Marissa Hayes is a 63 y.o. female admitted on 10/21/2018 with sepsis/cellulitis.  Pharmacy has been consulted for cefepime and vancomycin dosing.  Plan: Will continue Cefepime 2 g q8H   Pt received total loading dose of 2250 mg.   Will continue a maintenance dose of 1250 mg q24H.   Predicted AUC is 502. Goal AUC is 400-550. Scr 0.86. Css min 10.9.   Plan to order levels in 3-4 days.   Height: 5\' 6"  (167.6 cm) Weight: 199 lb (90.3 kg) IBW/kg (Calculated) : 59.3  Temp (24hrs), Avg:99.7 F (37.6 C), Min:99.2 F (37.3 C), Max:100.3 F (37.9 C)  Recent Labs  Lab 10/21/18 1615 10/21/18 1621 10/22/18 0527 10/23/18 0530  WBC  --  21.1* 16.8* 12.7*  CREATININE  --  0.94 0.98 0.86  LATICACIDVEN 1.9  --   --   --     Estimated Creatinine Clearance: 73.8 mL/min (by C-G formula based on SCr of 0.86 mg/dL).    No Known Allergies  Antimicrobials this admission: 8/5 cefepime >>  8/5 vancomycin >>   Dose adjustments this admission: None  Microbiology results: 8/5 BCx: NG x 2 days 8/6 UCx: Pending   Thank you for allowing pharmacy to be a part of this patient's care.  Lu Duffel, PharmD, BCPS Clinical Pharmacist 10/23/2018 7:47 AM

## 2018-10-23 NOTE — Progress Notes (Signed)
[facility] NAME: Marissa Hayes    MR#:  [id number]  DATE OF BIRTH:  1955/08/31  SUBJECTIVE:   Reports that her foot is still painful however erythema has improved.  REVIEW OF SYSTEMS:    Review of Systems  Constitutional: Negative for fever, chills weight loss HENT: Negative for ear pain, nosebleeds, congestion, facial swelling, rhinorrhea, neck pain, neck stiffness and ear discharge.   Respiratory: Negative for cough, shortness of breath, wheezing  Cardiovascular: Negative for chest pain, palpitations and leg swelling.  Gastrointestinal: Negative for heartburn, abdominal pain, vomiting, diarrhea or consitpation Genitourinary: Negative for dysuria, urgency, frequency, hematuria Musculoskeletal: Negative for back pain or joint pain Neurological: Negative for dizziness, seizures, syncope, focal weakness,  numbness and headaches.  Hematological: Does not bruise/bleed easily.  Psychiatric/Behavioral: Negative for hallucinations, confusion, dysphoric mood  Foot is painful  Tolerating Diet: yes      DRUG ALLERGIES:  No Known Allergies  VITALS:  Blood pressure 129/70, pulse 77, temperature 99.2 F (37.3 C), temperature source Oral, resp. rate 15, height 5\' 6"  (1.676 m), weight 90.3 kg, SpO2 96 %.  PHYSICAL EXAMINATION:  Constitutional: Appears well-developed and well-nourished. No distress. HENT: Normocephalic. Marland Kitchen Oropharynx is clear and moist.  Eyes: Conjunctivae and EOM are normal. PERRLA, no scleral icterus.  Neck: Normal ROM. Neck supple. No JVD. No tracheal deviation. CVS: RRR, S1/S2 +, no murmurs, no gallops, no carotid bruit.  Pulmonary: Effort and breath sounds normal, no stridor, rhonchi, wheezes, rales.  Abdominal: Soft. BS +,  no distension, tenderness, rebound or guarding.  Musculoskeletal: Normal range of motion. No edema and no tenderness.  Neuro: Alert. CN 2-12 grossly intact. No focal deficits. Skin: Erythema has  improved on her leg however she has a lot of swelling on her foot which may be concerning for abscess Psychiatric: Normal mood and affect.      LABORATORY PANEL:   CBC Recent Labs  Lab 10/23/18 0530  WBC 12.7*  HGB 10.9*  HCT 33.5*  PLT 162   ------------------------------------------------------------------------------------------------------------------  Chemistries  Recent Labs  Lab 10/21/18 1621  10/23/18 0530  NA 134*   < > 137  K 3.3*   < > 3.5  CL 99   < > 105  CO2 25   < > 25  GLUCOSE 144*   < > 118*  BUN 14   < > 9  CREATININE 0.94   < > 0.86  CALCIUM 8.8*   < > 8.5*  AST 39  --   --   ALT 19  --   --   ALKPHOS 66  --   --   BILITOT 0.7  --   --    < > = values in this interval not displayed.   ------------------------------------------------------------------------------------------------------------------  Cardiac Enzymes No results for input(s): TROPONINI in the last 168 hours. ------------------------------------------------------------------------------------------------------------------  RADIOLOGY:  US Venous Img Lower Unilateral Left  Result Date: 10/21/2018 CLINICAL DATA:  Left lower extremity pain and edema. EXAM: LEFT LOWER EXTREMITY VENOUS DOPPLER ULTRASOUND TECHNIQUE: Gray-scale sonography with graded compression, as well as color Doppler and duplex ultrasound were performed to evaluate the lower extremity deep venous systems from the level of the common femoral vein and including the common femoral, femoral, profunda femoral, popliteal and calf veins including the posterior tibial, peroneal and gastrocnemius veins when visible. The superficial great saphenous vein was also interrogated. Spectral Doppler was utilized to evaluate flow at rest and with distal augmentation maneuvers in  the common femoral, femoral and popliteal veins. COMPARISON:  None. FINDINGS: Contralateral Common Femoral Vein: Respiratory phasicity is normal and symmetric with the  symptomatic side. No evidence of thrombus. Normal compressibility. Common Femoral Vein: No evidence of thrombus. Normal compressibility, respiratory phasicity and response to augmentation. Saphenofemoral Junction: No evidence of thrombus. Normal compressibility and flow on color Doppler imaging. Profunda Femoral Vein: No evidence of thrombus. Normal compressibility and flow on color Doppler imaging. Femoral Vein: No evidence of thrombus. Normal compressibility, respiratory phasicity and response to augmentation. Popliteal Vein: No evidence of thrombus. Normal compressibility, respiratory phasicity and response to augmentation. Calf Veins: No evidence of thrombus. Normal compressibility and flow on color Doppler imaging. Superficial Great Saphenous Vein: No evidence of thrombus. Normal compressibility. Venous Reflux:  None. Other Findings: Small fluid collection in the left popliteal fossa measures approximately 4.5 x 1.1 x 2.5 cm and is consistent with a Baker's cyst. No evidence of superficial thrombophlebitis. IMPRESSION: No evidence of left lower extremity deep venous thrombosis. Left popliteal fossa Baker's cyst present. Electronically Signed   By: Irish LackGlenn  Yamagata M.D.   On: 10/21/2018 18:59     ASSESSMENT AND PLAN:   63 year old female who presented to the emergency room due to left leg swelling and pain.   1.  Sepsis: Patient presented with elevated white blood cell count and fever.  Sepsis is due to cellulitis. Sepsis is improving 2.  Left lower extremity cellulitis: Although erythema has improved from marked line she has a lot of swelling on the foot and I am concerned that this could be evolving into an abscess. CT scan ordered and patient consents. Continue vancomycin and cefepime  3.  Hypokalemia: Repleted and improved.  Management plans discussed with the patient and she is in agreement.  CODE STATUS: full  TOTAL TIME TAKING CARE OF THIS PATIENT: 30 minutes.     POSSIBLE D/C  tomorrow, DEPENDING ON CLINICAL CONDITION.   Adrian SaranSital Kenwood Rosiak M.D on 10/23/2018 at 10:22 AM  Between 7am to 6pm - Pager - [phone] After 6pm go to [email or web address] - password [email or web address]  [facility]  Office  [phone]  CC: Primary care physician; Patient, No Pcp Per  Note: This dictation was prepared with Dragon dictation along with smaller phrase technology. Any transcriptional errors that result from this process are unintentional.

## 2018-10-24 LAB — CREATININE, SERUM
Creatinine, Ser: 0.79 mg/dL (ref 0.44–1.00)
GFR calc Af Amer: >60 mL/min (ref >60)
GFR calc non Af Amer: >60 mL/min (ref >60)

## 2018-10-24 MED ORDER — CLINDAMYCIN HCL 300 MG PO CAPS: 300.0000 mg | ORAL_CAPSULE | Freq: Three times a day (TID) | ORAL | 0 refills | Status: DC

## 2018-10-24 MED ORDER — CLINDAMYCIN HCL 150 MG PO CAPS
300.0000 mg | ORAL_CAPSULE | Freq: Three times a day (TID) | ORAL | Status: DC
  Administered 2018-10-24: 300 mg via ORAL
  Filled 2018-10-24 (×3): qty 2

## 2018-10-24 NOTE — Discharge Summary (Signed)
[facility] [place] [facility]   PATIENT NAME: Marissa Hayes    MR#:  [id number]  DATE OF BIRTH:  12/26/1955  DATE OF ADMISSION:  10/21/2018 ADMITTING PHYSICIAN: Ihor AustinPavan Pyreddy, MD  DATE OF DISCHARGE: October 24, 2018  PRIMARY CARE PHYSICIAN: Patient, No Pcp Per    ADMISSION DIAGNOSIS:  Cellulitis of left lower extremity [L03.116] Sepsis without acute organ dysfunction, due to unspecified organism (HCC) [A41.9]  DISCHARGE DIAGNOSIS:  Active Problems:   Cellulitis   SECONDARY DIAGNOSIS:  History reviewed. No pertinent past medical history.  HOSPITAL COURSE:   63 year old female who presented to the emergency room due to left leg swelling and pain.   1.  Sepsis: Patient presented with elevated white blood cell count and fever.    Sepsis was due to lower extremity cellulitis.  Sepsis has resolved.     2.  Left lower extremity cellulitis: Patient was on broad-spectrum antibiotics including cefepime and vancomycin.  She elevated her leg.  I ordered a CT scan to evaluate for underlying abscess.  This was negative for an abscess.  Her cellulitis is much improved.  The erythema has decreased as her swelling has also.  She was discharged on oral clindamycin.  She will continue elevate leg.  She does not have a PCP and therefore I have assigned her to Dr. Marcelino DusterJohn Johnston and have instructed her to make sure that her insurance covers him before she makes the appointment.    3.  Hypokalemia: Repleted and improved   DISCHARGE CONDITIONS AND DIET:   Stable for discharge regular diet  CONSULTS OBTAINED:    DRUG ALLERGIES:  No Known Allergies  DISCHARGE MEDICATIONS:   Allergies as of 10/24/2018   No Known Allergies     Medication List    TAKE these medications   clindamycin 300 MG capsule Commonly known as: CLEOCIN Take 1 capsule (300 mg total) by mouth every 8 (eight) hours for 7 days.         Today   CHIEF COMPLAINT:  Patient is doing well and would  like to go home.  She is able to move her leg and her ankle.  She is able to ambulate it.  She reports that the swelling and erythema has improved since admission.   VITAL SIGNS:  Blood pressure (!) 121/52, pulse 74, temperature 98.7 F (37.1 C), temperature source Oral, resp. rate 18, height 5\' 6"  (1.676 m), weight 90.3 kg, SpO2 100 %.   REVIEW OF SYSTEMS:  Review of Systems  Constitutional: Negative.  Negative for chills, fever and malaise/fatigue.  HENT: Negative.  Negative for ear discharge, ear pain, hearing loss, nosebleeds and sore throat.   Eyes: Negative.  Negative for blurred vision and pain.  Respiratory: Negative.  Negative for cough, hemoptysis, shortness of breath and wheezing.   Cardiovascular: Positive for leg swelling. Negative for chest pain and palpitations.  Gastrointestinal: Negative.  Negative for abdominal pain, blood in stool, diarrhea, nausea and vomiting.  Genitourinary: Negative.  Negative for dysuria.  Musculoskeletal: Negative.  Negative for back pain.  Skin:       Improved cellulitis and swelling  Neurological: Negative for dizziness, tremors, speech change, focal weakness, seizures and headaches.  Endo/Heme/Allergies: Negative.  Does not bruise/bleed easily.  Psychiatric/Behavioral: Negative.  Negative for depression, hallucinations and suicidal ideas.     PHYSICAL EXAMINATION:  GENERAL:  63 y.o.-year-old patient lying in the bed with no acute distress.  NECK:  Supple, no jugular venous distention. No thyroid enlargement,  no tenderness.  LUNGS: Normal breath sounds bilaterally, no wheezing, rales,rhonchi  No use of accessory muscles of respiration.  CARDIOVASCULAR: S1, S2 normal. No murmurs, rubs, or gallops.  ABDOMEN: Soft, non-tender, non-distended. Bowel sounds present. No organomegaly or mass.  EXTREMITIES: Improved cellulitis/erythema and swelling left leg PSYCHIATRIC: The patient is alert and oriented x 3.  SKIN: No obvious rash, lesion, or  ulcer.   DATA REVIEW:   CBC Recent Labs  Lab 10/23/18 0530  WBC 12.7*  HGB 10.9*  HCT 33.5*  PLT 162    Chemistries  Recent Labs  Lab 10/21/18 1621  10/23/18 0530 10/24/18 0537  NA 134*   < > 137  --   K 3.3*   < > 3.5  --   CL 99   < > 105  --   CO2 25   < > 25  --   GLUCOSE 144*   < > 118*  --   BUN 14   < > 9  --   CREATININE 0.94   < > 0.86 0.79  CALCIUM 8.8*   < > 8.5*  --   AST 39  --   --   --   ALT 19  --   --   --   ALKPHOS 66  --   --   --   BILITOT 0.7  --   --   --    < > = values in this interval not displayed.    Cardiac Enzymes No results for input(s): TROPONINI in the last 168 hours.  Microbiology Results  @[email or web address] @  RADIOLOGY:  Ct Foot Left W Contrast  Result Date: 10/23/2018 CLINICAL DATA:  Aricept bullous, left lower extremity pain and swelling and redness the the the the the EXAM: CT OF THE LOWER LEFT EXTREMITY WITH CONTRAST TECHNIQUE: Multidetector CT imaging of the lower left extremity was performed according to the standard protocol following intravenous contrast administration. COMPARISON:  None. CONTRAST:  123mL OMNIPAQUE IOHEXOL 300 MG/ML  SOLN FINDINGS: Bones/Joint/Cartilage No fracture or dislocation. Calcaneal enthesophyte seen at the Achilles insertion site and on the plantar surface. Mild joint space narrowing seen at the DIP joints of the foot. Os naviculare is present. The ankle mortise appears congruent. No large joint effusions are seen. Ligaments Suboptimally assessed by CT. Muscles and Tendons The muscles and tendons are intact. Soft tissues There is diffuse subcutaneous edema and skin thickening seen around the ankle and the dorsum of the foot. This is predominantly on the anterolateral aspect of the ankle and hindfoot. There is non loculated fluid seen within the subcutaneous tissue along the anterolateral aspect of the ankle. No enhancing loculated fluid collections are seen. There is normal enhancement of the vasculature. No  filling definite defects are noted. IMPRESSION: 1. Findings consistent with cellulitis and phlegmon without evidence of abscess. 2. No acute osseous abnormality. Electronically Signed   By: Prudencio Pair M.D.   On: 10/23/2018 12:11      Allergies as of 10/24/2018   No Known Allergies     Medication List    TAKE these medications   clindamycin 300 MG capsule Commonly known as: CLEOCIN Take 1 capsule (300 mg total) by mouth every 8 (eight) hours for 7 days.          Management plans discussed with the patient and she is in agreement. Stable for discharge home  Patient should follow up with currently I have assigned Dr. Harrel Lemon as her PCP  CODE STATUS:  Code Status Orders  (From admission, onward)         Start     Ordered   10/21/18 2059  Full code  Continuous     10/21/18 2058        Code Status History    This patient has a current code status but no historical code status.   Advance Care Planning Activity      TOTAL TIME TAKING CARE OF THIS PATIENT: 38 minutes.    Note: This dictation was prepared with Dragon dictation along with smaller phrase technology. Any transcriptional errors that result from this process are unintentional.  Adrian SaranSital Kaaliyah Kita M.D on 10/24/2018 at 9:32 AM  Between 7am to 6pm - Pager - [phone] After 6pm go to [email or web address] - password [email or web address]  [facility] [place]  Office  [phone]  CC: Primary care physician; Patient, No Pcp Per

## 2018-10-24 NOTE — Progress Notes (Signed)
Received MD order to discharge patient to home, reviewed prescriptions, discharge institutions and follow up appointments with patient and patient verbalized  understanding

## 2018-10-26 LAB — CULTURE, BLOOD (ROUTINE X 2)
Culture: NO GROWTH
Culture: NO GROWTH
Special Requests: ADEQUATE

## 2018-10-30 ENCOUNTER — Emergency Department
Admission: EM | Admit: 2018-10-30 | Discharge: 2018-10-30 | Disposition: A | Discharge status: Home / Self Care | Payer: Self-pay | Attending: Emergency Medicine | Admitting: Emergency Medicine

## 2018-10-30 ENCOUNTER — Other Ambulatory Visit: Payer: Self-pay

## 2018-10-30 ENCOUNTER — Encounter: Payer: Self-pay | Admitting: Emergency Medicine

## 2018-10-30 DIAGNOSIS — Z0289 Encounter for other administrative examinations: Secondary | ICD-10-CM

## 2018-10-30 DIAGNOSIS — L03116 Cellulitis of left lower limb: Secondary | ICD-10-CM | POA: Insufficient documentation

## 2018-10-30 MED ORDER — CLINDAMYCIN HCL 300 MG PO CAPS: 300.0000 mg | ORAL_CAPSULE | Freq: Three times a day (TID) | ORAL | 0 refills | Status: AC

## 2018-10-30 NOTE — ED Triage Notes (Signed)
Pt to ED via POV stating that she was discharged from the hospital on Saturday, pt is being treated for cellulitis. Pt states that she was told to follow up with her PCP but he is out of the office until September. Pt states that she is hoping to return to work on Monday. Pt is still having PO antibiotics, states that the swelling in her leg has been improving. Pt is in NAD at this time.

## 2018-10-30 NOTE — ED Notes (Signed)
See triage note  Presents for recheck of left lower leg  States she was discharged and instructed f/u   But is not able to see PCP  Leg remains red and sl swollen  But pt states leg feels better

## 2018-10-30 NOTE — ED Provider Notes (Signed)
[facility] Emergency Department Provider Note  ____________________________________________  Time seen: Approximately 1:33 PM  I have reviewed the triage vital signs and the nursing notes.   HISTORY  Chief Complaint Follow-up   HPI TIMIKO OFFUTT is a 63 y.o. female presenting to the ER for a second evaluation and possible work note to be able to return on Monday. She was recently admitted for cellulitis. She is improving.     History reviewed. No pertinent past medical history.  Patient Active Problem List   Diagnosis Date Noted  . Cellulitis 10/21/2018    Past Surgical History:  Procedure Laterality Date  . none      Prior to Admission medications   Medication Sig Start Date End Date Taking? Authorizing Provider  clindamycin (CLEOCIN) 300 MG capsule Take 1 capsule (300 mg total) by mouth every 8 (eight) hours for 5 days. 10/30/18 11/04/18  Victorino Dike, FNP    Allergies Patient has no known allergies.  No family history on file.  Social History Social History   Tobacco Use  . Smoking status: Never Smoker  . Smokeless tobacco: Never Used  Substance Use Topics  . Alcohol use: Never    Frequency: Never  . Drug use: Never    Review of Systems  Constitutional: Negative for fever. Respiratory: Negative for cough or shortness of breath.  Musculoskeletal: Negative for myalgias Skin: Positive for erythematous area on the left lower extremity. Neurological: Negative for numbness or paresthesias. ____________________________________________   PHYSICAL EXAM:  VITAL SIGNS: ED Triage Vitals  Enc Vitals Group     BP 10/30/18 1212 (!) 141/59     Pulse Rate 10/30/18 1212 62     Resp 10/30/18 1212 16     Temp 10/30/18 1215 98.9 F (37.2 C)     Temp Source 10/30/18 1212 Oral     SpO2 10/30/18 1212 99 %     Weight 10/30/18 1213 199 lb 1.2 oz (90.3 kg)     Height 10/30/18 1213 5\' 6"  (1.676 m)     Head Circumference --      Peak Flow --       Pain Score 10/30/18 1212 1     Pain Loc --      Pain Edu? --      Excl. in [place]? --      Constitutional: Well appearing. Eyes: Conjunctivae are clear without discharge or drainage. Nose: No rhinorrhea noted. Mouth/Throat: Airway is patent.  Neck: No stridor. Unrestricted range of motion observed. Cardiovascular: Capillary refill is <3 seconds.  Respiratory: Respirations are even and unlabored.. Musculoskeletal: Unrestricted range of motion observed. Neurologic: Awake, alert, and oriented x 4.  Skin: Left lower extremity swollen below the knee. 3+ pitting edema. Skin is mildly erythematous.  ____________________________________________   LABS (all labs ordered are listed, but only abnormal results are displayed)  Labs Reviewed - No data to display ____________________________________________  EKG  Not indicated. ____________________________________________  RADIOLOGY  Not indicated. ____________________________________________   PROCEDURES  Procedures ____________________________________________   INITIAL IMPRESSION / ASSESSMENT AND PLAN / ED COURSE  ALIYA SOL is a 63 y.o. female who presents to the emergency department for follow up visit and note to return to work. She has one day of antibiotic left. The leg is still swollen and erythematous. I wrote for 5 additional days and encouraged her to return if it worsens again. She was encouraged to elevate it. She will work at a station where she can sit down. Work  release provided.   Medications - No data to display   Pertinent labs & imaging results that were available during my care of the patient were reviewed by me and considered in my medical decision making (see chart for details).  ____________________________________________   FINAL CLINICAL IMPRESSION(S) / ED DIAGNOSES  Final diagnoses:  Cellulitis of left lower extremity  Encounter to obtain excuse from work    ED Discharge Orders          Ordered    clindamycin (CLEOCIN) 300 MG capsule  Every 8 hours     10/30/18 1340           Note:  This document was prepared using Dragon voice recognition software and may include unintentional dictation errors.   Chinita Pesterriplett, Trini Christiansen B, FNP 10/30/18 1350    Sharyn CreamerQuale, Mark, MD 10/30/18 1410

## 2018-11-02 ENCOUNTER — Telehealth: Payer: Self-pay | Admitting: *Deleted

## 2018-11-02 ENCOUNTER — Inpatient Hospital Stay
Admission: EM | Admit: 2018-11-02 | Discharge: 2018-11-04 | DRG: 603 | Disposition: A | Discharge status: Home / Self Care | Payer: Medicare Other | Attending: Internal Medicine | Admitting: Internal Medicine

## 2018-11-02 ENCOUNTER — Emergency Department: Payer: Medicare Other

## 2018-11-02 ENCOUNTER — Other Ambulatory Visit: Payer: Self-pay

## 2018-11-02 DIAGNOSIS — Z20828 Contact with and (suspected) exposure to other viral communicable diseases: Secondary | ICD-10-CM | POA: Diagnosis present

## 2018-11-02 DIAGNOSIS — L03116 Cellulitis of left lower limb: Secondary | ICD-10-CM | POA: Diagnosis present

## 2018-11-02 LAB — CBC WITH DIFFERENTIAL/PLATELET
Abs Immature Granulocytes: 0.02 10*3/uL (ref 0.00–0.07)
Basophils Absolute: 0.1 10*3/uL (ref 0.0–0.1)
Basophils Relative: 1 %
Eosinophils Absolute: 0.1 10*3/uL (ref 0.0–0.5)
Eosinophils Relative: 2 %
HCT: 36.2 % (ref 36.0–46.0)
Hemoglobin: 11.6 g/dL — ABNORMAL LOW (ref 12.0–15.0)
Immature Granulocytes: 0 %
Lymphocytes Relative: 31 %
Lymphs Abs: 2.2 10*3/uL (ref 0.7–4.0)
MCH: 30 pg (ref 26.0–34.0)
MCHC: 32 g/dL (ref 30.0–36.0)
MCV: 93.5 fL (ref 80.0–100.0)
Monocytes Absolute: 0.4 10*3/uL (ref 0.1–1.0)
Monocytes Relative: 6 %
Neutro Abs: 4.2 10*3/uL (ref 1.7–7.7)
Neutrophils Relative %: 60 %
Platelets: 418 10*3/uL — ABNORMAL HIGH (ref 150–400)
RBC: 3.87 MIL/uL (ref 3.87–5.11)
RDW: 13.1 % (ref 11.5–15.5)
WBC: 7 10*3/uL (ref 4.0–10.5)
nRBC: 0 % (ref 0.0–0.2)

## 2018-11-02 LAB — BASIC METABOLIC PANEL
Anion gap: 5 (ref 5–15)
BUN: 22 mg/dL (ref 8–23)
CO2: 29 mmol/L (ref 22–32)
Calcium: 9.2 mg/dL (ref 8.9–10.3)
Chloride: 104 mmol/L (ref 98–111)
Creatinine, Ser: 0.91 mg/dL (ref 0.44–1.00)
GFR calc Af Amer: >60 mL/min (ref >60)
GFR calc non Af Amer: >60 mL/min (ref >60)
Glucose, Bld: 92 mg/dL (ref 70–99)
Potassium: 4.4 mmol/L (ref 3.5–5.1)
Sodium: 138 mmol/L (ref 135–145)

## 2018-11-02 LAB — SARS CORONAVIRUS 2 BY RT PCR (HOSPITAL ORDER, PERFORMED IN ~~LOC~~ HOSPITAL LAB): SARS Coronavirus 2: NEGATIVE

## 2018-11-02 MED ORDER — VANCOMYCIN HCL 10 G IV SOLR
2000.0000 mg | Freq: Once | INTRAVENOUS | Status: AC
  Administered 2018-11-02: 2000 mg via INTRAVENOUS
  Filled 2018-11-02: qty 2000

## 2018-11-02 MED ORDER — VANCOMYCIN HCL IN DEXTROSE 1-5 GM/200ML-% IV SOLN: 1000.0000 mg | Freq: Once | INTRAVENOUS | Status: DC

## 2018-11-02 MED ORDER — VANCOMYCIN HCL 1.25 G IV SOLR: 1250.0000 mg | INTRAVENOUS | Status: DC

## 2018-11-02 MED ORDER — SODIUM CHLORIDE 0.9 % IV SOLN: 1.0000 g | Freq: Once | INTRAVENOUS | Status: DC

## 2018-11-02 MED ORDER — ENOXAPARIN SODIUM 40 MG/0.4ML ~~LOC~~ SOLN
40.0000 mg | SUBCUTANEOUS | Status: DC
  Administered 2018-11-02 – 2018-11-03 (×2): 40 mg via SUBCUTANEOUS
  Filled 2018-11-02 (×2): qty 0.4

## 2018-11-02 MED ORDER — SODIUM CHLORIDE 0.9 % IV SOLN
2.0000 g | Freq: Three times a day (TID) | INTRAVENOUS | Status: DC
  Administered 2018-11-02 – 2018-11-04 (×6): 2 g via INTRAVENOUS
  Filled 2018-11-02 (×8): qty 2

## 2018-11-02 NOTE — ED Provider Notes (Signed)
[facility] Emergency Department Provider Note  ____________________________________________   I have reviewed the triage vital signs and the nursing notes. Where available I have reviewed prior notes and, if possible and indicated, outside hospital notes.   Patient seen and evaluated during the coronavirus epidemic during a time with low staffing  Patient seen for the symptoms described in the history of present illness. She was evaluated in the context of the global COVID-19 pandemic, which necessitated consideration that the patient might be at risk for infection with the SARS-CoV-2 virus that causes COVID-19. Institutional protocols and algorithms that pertain to the evaluation of patients at risk for COVID-19 are in a state of rapid change based on information released by regulatory bodies including the CDC and federal and state organizations. These policies and algorithms were followed during the patient's care in the ED.    HISTORY  Chief Complaint Leg Swelling    HPI Marissa Hayes is a 63 y.o. female  Who presents today complaining of  left lower extremity cellulitis which is reduced curd.  Patient had cellulitis about 10 days ago received antibiotics and is still on antibiotics.  She got better and then a couple days ago the swelling got much worse again.  She did have a negative Doppler at the initial presentation as well as a CT scan which showed cellulitis.  She denies any fever or systemic illness.  No prior history of this.  Chest pain no shortness of breath.  Leg is significantly more swollen than it was a couple days ago she states.  Feels like the cellulitis is recurring.  Also tender.   History reviewed. No pertinent past medical history.  Patient Active Problem List   Diagnosis Date Noted  . Cellulitis 10/21/2018    Past Surgical History:  Procedure Laterality Date  . none      Prior to Admission medications   Medication Sig Start Date End  Date Taking? Authorizing Provider  clindamycin (CLEOCIN) 300 MG capsule Take 1 capsule (300 mg total) by mouth every 8 (eight) hours for 5 days. 10/30/18 11/04/18  Chinita Pesterriplett, Cari B, FNP    Allergies Patient has no known allergies.  No family history on file.  Social History Social History   Tobacco Use  . Smoking status: Never Smoker  . Smokeless tobacco: Never Used  Substance Use Topics  . Alcohol use: Never    Frequency: Never  . Drug use: Never    Review of Systems Constitutional: No fever/chills Eyes: No visual changes. ENT: No sore throat. No stiff neck no neck pain Cardiovascular: Denies chest pain. Respiratory: Denies shortness of breath. Gastrointestinal:   no vomiting.  No diarrhea.  No constipation. Genitourinary: Negative for dysuria. Musculoskeletal: + lower extremity swelling Skin: Negative for rash. Neurological: Negative for severe headaches, focal weakness or numbness.   ____________________________________________   PHYSICAL EXAM:  VITAL SIGNS: ED Triage Vitals  Enc Vitals Group     BP 11/02/18 1029 (!) 143/71     Pulse Rate 11/02/18 1029 65     Resp 11/02/18 1029 17     Temp 11/02/18 1029 98.4 F (36.9 C)     Temp Source 11/02/18 1029 Oral     SpO2 11/02/18 1029 99 %     Weight 11/02/18 1030 198 lb (89.8 kg)     Height 11/02/18 1030 5\' 7"  (1.702 m)     Head Circumference --      Peak Flow --      Pain  Score 11/02/18 1030 1     Pain Loc --      Pain Edu? --      Excl. in GC? --     Constitutional: Alert and oriented. Well appearing and in no acute distress. Eyes: Conjunctivae are normal Head: Atraumatic HEENT: No congestion/rhinnorhea. Mucous membranes are moist.  Oropharynx non-erythematous Neck:   Nontender with no meningismus, no masses, no stridor Cardiovascular: Normal rate, regular rhythm. Grossly normal heart sounds.  Good peripheral circulation. Respiratory: Normal respiratory effort.  No retractions. Lungs CTAB. Abdominal: Soft  and nontender. No distention. No guarding no rebound Back:  There is no focal tenderness or step off.  there is no midline tenderness there are no lesions noted. there is no CVA tenderness Musculoskeletal: No lower extremity tenderness, no upper extremity tenderness. No joint effusions, no DVT signs strong distal pulses no edema the right, on the left however there is significant swelling, it is warm to touch up to the midshin, and there is significant edema.  There is good strong pulses cap refill is normal and there is soft compartments with no crepitus.  Strong distal pulses noted both sides. Neurologic:  Normal speech and language. No gross focal neurologic deficits are appreciated.  Skin:  Skin is warm, dry and intact.  Erythema noted to foot and lower leg on the left Psychiatric: Mood and affect are normal. Speech and behavior are normal.  ____________________________________________   LABS (all labs ordered are listed, but only abnormal results are displayed)  Labs Reviewed  CBC WITH DIFFERENTIAL/PLATELET - Abnormal; Notable for the following components:      Result Value   Hemoglobin 11.6 (*)    Platelets 418 (*)    All other components within normal limits  SARS CORONAVIRUS 2 (HOSPITAL ORDER, PERFORMED IN [place] [facility])  CULTURE, BLOOD (ROUTINE X 2)  CULTURE, BLOOD (ROUTINE X 2)  BASIC METABOLIC PANEL    Pertinent labs  results that were available during my care of the patient were reviewed by me and considered in my medical decision making (see chart for details). ____________________________________________  EKG  I personally interpreted any EKGs ordered by me or triage _________________________________________  RADIOLOGY  Pertinent labs & imaging results that were available during my care of the patient were reviewed by me and considered in my medical decision making (see chart for details). If possible, patient and/or family made aware of any abnormal  findings.  No results found. ____________________________________________    PROCEDURES  Procedure(s) performed: None  Procedures  Critical Care performed: None  ____________________________________________   INITIAL IMPRESSION / ASSESSMENT AND PLAN / ED COURSE  Pertinent labs & imaging results that were available during my care of the patient were reviewed by me and considered in my medical decision making (see chart for details).   Patient with what appears to be recurrent cellulitis, possibility exists for DVT although she had a negative blood clot work-up for this when it for started we will repeat that given the swelling, patient has strong pulses, and compartments are soft nothing to suggest that this is an acute aortic or vascular catastrophe, and there is no evidence of compartment syndrome or gas gangrene.  Patient will however receive antibiotics I have talked to the hospitalist would like to see her first before starting them.  We will admit her to the hospitalist service but given that she has a recurrence of her cellulitis despite home antibiotics    ____________________________________________   FINAL CLINICAL IMPRESSION(S) / ED  DIAGNOSES  Final diagnoses:  Cellulitis of left lower extremity      This chart was dictated using voice recognition software.  Despite best efforts to proofread,  errors can occur which can change meaning.      Schuyler Amor, MD 11/02/18 1326

## 2018-11-02 NOTE — Telephone Encounter (Signed)
Never seen patient before yes best if she goes to ED  I cant fill out FMLA until  1. I know the reason and decide if it is appropriate  2. Until after I evaluate he and we go from there  3. If we have sooner than 8/28 move appt sooner otherwise she will have to wait until her appt time    [place]

## 2018-11-02 NOTE — ED Notes (Signed)
Report called by float pool RN to 1A charge nurse. Pt transported via wheelchair with float pool RN.

## 2018-11-02 NOTE — Telephone Encounter (Signed)
Patient in ED now is there away to move her NP appointment up from the [facility]?

## 2018-11-02 NOTE — Telephone Encounter (Signed)
Patient seen at ED and scheduling note sent to scheduler.

## 2018-11-02 NOTE — H&P (Addendum)
[facility] NAME: Nalini Alcaraz    MR#:  [id number]  DATE OF BIRTH:  July 11, 1955  DATE OF ADMISSION:  11/02/2018  PRIMARY CARE PHYSICIAN: Patient, No Pcp Per   REQUESTING/REFERRING PHYSICIAN: Charlotte Crumb, MD  CHIEF COMPLAINT:   Chief Complaint  Patient presents with  . Leg Swelling    HISTORY OF PRESENT ILLNESS:   63 year old female with no significant past medical history presenting to the ED with worsening left leg swelling, redness and pain.  Patient was recently admitted on 10/21/2018 for similar presentation and found to have cellulitis of the left leg.  Venous Doppler ultrasound at that time did not reveal any DVT, lab work-up showed elevated WBC therefore patient was started on IV antibiotics with cefepime and vancomycin.  Although erythema had improved markedly patient still had a lot of swelling in the food therefore CT scan was ordered which did not show any abscess.  Patient was discharged on oral clindamycin on 10/24/2018.  Patient states since discharge, swelling in her leg is been improving but she still had pain and difficulty walking.  She presented to the ED on 10/30/2018 for second evaluation and clearance to return to work.  She was discharged from the ED to continue clindamycin for additional 5 days.  Patient called her PCP today complaining of worsening symptoms not improving on oral clindamycin.  She was advised to present to the ED for further evaluation.  On arrival to the ED, she was afebrile with blood pressure 143/71 mm Hg and pulse rate 65 beats/min.  Initial labs revealed unremarkable CBC and CMP.  COVID-19 negative.  Ultrasound venous of lower extremity was ordered still pending.  Given worsening symptoms despite treatment patient will be admitted for further management under hospitalist service.  PAST MEDICAL HISTORY:  History reviewed. No pertinent past medical history.  PAST SURGICAL HISTORY:   Past  Surgical History:  Procedure Laterality Date  . none      SOCIAL HISTORY:   Social History   Tobacco Use  . Smoking status: Never Smoker  . Smokeless tobacco: Never Used  Substance Use Topics  . Alcohol use: Never    Frequency: Never    FAMILY HISTORY:  No family history on file.  DRUG ALLERGIES:  No Known Allergies  REVIEW OF SYSTEMS:   Review of Systems  Constitutional: Negative for chills, fever, malaise/fatigue and weight loss.  HENT: Negative for congestion, hearing loss and sore throat.   Eyes: Negative for blurred vision and double vision.  Respiratory: Negative for cough, shortness of breath and wheezing.   Cardiovascular: Positive for leg swelling. Negative for chest pain, palpitations and orthopnea.  Gastrointestinal: Negative for abdominal pain, diarrhea, nausea and vomiting.  Genitourinary: Negative for dysuria and urgency.  Musculoskeletal: Negative for myalgias.       Left leg pain  Skin: Negative for rash.       Left leg erythema  Neurological: Negative for dizziness, sensory change, speech change, focal weakness and headaches.  Psychiatric/Behavioral: Negative for depression.   MEDICATIONS AT HOME:   Prior to Admission medications   Medication Sig Start Date End Date Taking? Authorizing Provider  clindamycin (CLEOCIN) 300 MG capsule Take 1 capsule (300 mg total) by mouth every 8 (eight) hours for 5 days. 10/30/18 11/04/18  Triplett, Johnette Abraham B, FNP      VITAL SIGNS:  Blood pressure (!) 129/92, pulse 64, temperature 98.4 F (36.9 C), temperature source Oral, resp. rate 17, height  5\' 7"  (1.702 m), weight 89.8 kg, SpO2 100 %.  PHYSICAL EXAMINATION:   Physical Exam  GENERAL:  63 y.o.-year-old patient lying in the bed with no acute distress.  EYES: Pupils equal, round, reactive to light and accommodation. No scleral icterus. Extraocular muscles intact.  HEENT: Head atraumatic, normocephalic.Dry oral mucosa  NECK: Supple, no jugular venous distention.  No thyroid enlargement, no tenderness.  LUNGS: Normal breath sounds bilaterally, no wheezing, rales,rhonchi or crepitation. No use of accessory muscles of respiration.  CARDIOVASCULAR: S1, S2 normal.  No murmurS, rubs, or gallops.  ABDOMEN: Soft, nontender, nondistended. Bowel sounds present. No organomegaly or mass.  EXTREMITIES: No pedal edema, cyanosis, or clubbing.  NEUROLOGIC: Cranial nerves II through XII are intact. Muscle strength 5/5 in all extremities. Sensation intact. Gait not checked.  PSYCHIATRIC: The patient is alert and oriented x 3.  SKIN: See below        DATA REVIEWED:  LABORATORY PANEL:   CBC Recent Labs  Lab 11/02/18 1157  WBC 7.0  HGB 11.6*  HCT 36.2  PLT 418*   ------------------------------------------------------------------------------------------------------------------  Chemistries  Recent Labs  Lab 11/02/18 1157  NA 138  K 4.4  CL 104  CO2 29  GLUCOSE 92  BUN 22  CREATININE 0.91  CALCIUM 9.2   ------------------------------------------------------------------------------------------------------------------  Cardiac Enzymes No results for input(s): TROPONINI in the last 168 hours. ------------------------------------------------------------------------------------------------------------------  RADIOLOGY:  No results found.  EKG:  EKG: normal EKG, normal sinus rhythm, unchanged from previous tracings.  IMPRESSION AND PLAN:   63 y.o. female with no significant past medical history presenting to the ED with worsening left leg swelling, redness and pain.  1.  Left leg cellulitis -patient recently admitted with left leg cellulitis treated with Vanco and cefepime.  Discharged on oral clindamycin with no improvement. - Admit to MedSurg unit - Lab work reassuring with no signs of sepsis - Recent CT of leg shows cellulitis with no evidence of abscess, will hold off further imaging unless indicated or not improving with IV abx - Blood  cultures pending - US venous of LLE rule out DVT - Start empiric antibiotics with vancomycin and cefepime  2. Impaired mobility -due to left leg cellulitis - PT consult  3. DVT prophylaxis - Enoxaparin SubQ    All the records are reviewed and case discussed with ED provider. Management plans discussed with the patient, family and they are in agreement.  CODE STATUS: FULL  TOTAL TIME TAKING CARE OF THIS PATIENT: 50 minutes.    on 11/02/2018 at 1:04 PM  Webb SilversmithElizabeth Jahking Lesser, DNP, FNP-BC [facility] Nurse Practitioner Between 7am to 6pm - Pager [phone]- [phone]  After 6pm go to [email or web address] - password [email or web address]  [facility] [place] [facility]  Office  [phone]  CC: Primary care physician; Patient, No Pcp Per

## 2018-11-02 NOTE — ED Triage Notes (Signed)
Pt was admitted recently for cellulitis in the left leg with swelling  Pt c/o not improving and is currently taking clindamycin . Noted pitting edema with redness.

## 2018-11-02 NOTE — ED Notes (Signed)
ED TO INPATIENT HANDOFF REPORT  ED Nurse Name and Phone #:  Helmut Musterlicia [phone]  S Name/Age/Gender Marissa Hayes 63 y.o. female Room/Bed: ED10A/ED10A  Code Status   Code Status: Full Code  Home/SNF/Other Home Patient oriented to: self, place, time and situation Is this baseline? Yes   Triage Complete: Triage complete  Chief Complaint left leg swelling  Triage Note Pt was admitted recently for cellulitis in the left leg with swelling  Pt c/o not improving and is currently taking clindamycin . Noted pitting edema with redness.    Allergies No Known Allergies  Level of Care/Admitting Diagnosis ED Disposition    ED Disposition Condition Comment   Admit  [facility] [[id number]]  Level of Care: Med-Surg [16]  Covid Evaluation: Confirmed COVID Negative  Diagnosis: Cellulitis of left leg [[id number]][[id number]]  Admitting Physician: Webb SilversmithOUMA, ELIZABETH [facility] [[id number]]  Attending Physician: Webb SilversmithUMA, ELIZABETH ACHIENG [[id number]]  Estimated length of stay: past midnight tomorrow  Certification:: I certify this patient will need inpatient services for at least 2 midnights  PT Class (Do Not Modify): Inpatient [101]  PT Acc Code (Do Not Modify): Private [1]       B Medical/Surgery History History reviewed. No pertinent past medical history. Past Surgical History:  Procedure Laterality Date  . none       A IV Location/Drains/Wounds Patient Lines/Drains/Airways Status   Active Line/Drains/Airways    Name:   Placement date:   Placement time:   Site:   Days:   Peripheral IV 11/02/18 Left Antecubital   11/02/18    1155    Antecubital   less than 1          Intake/Output Last 24 hours No intake or output data in the 24 hours ending 11/02/18 1357  Labs/Imaging Results for orders placed or performed during the hospital encounter of 11/02/18 (from the past 48 hour(s))  SARS Coronavirus 2 [facility](Hospital order, Performed in [facility]) Nasopharyngeal  Nasopharyngeal Swab     Status: None   Collection Time: 11/02/18 11:31 AM   Specimen: Nasopharyngeal Swab  Result Value Ref Range   SARS Coronavirus 2 NEGATIVE NEGATIVE    Comment: (NOTE) If result is NEGATIVE SARS-CoV-2 target nucleic acids are NOT DETECTED. The SARS-CoV-2 RNA is generally detectable in upper and lower  respiratory specimens during the acute phase of infection. The lowest  concentration of SARS-CoV-2 viral copies this assay can detect is 250  copies / mL. A negative result does not preclude SARS-CoV-2 infection  and should not be used as the sole basis for treatment or other  patient management decisions.  A negative result may occur with  improper specimen collection / handling, submission of specimen other  than nasopharyngeal swab, presence of viral mutation(s) within the  areas targeted by this assay, and inadequate number of viral copies  (<250 copies / mL). A negative result must be combined with clinical  observations, patient history, and epidemiological information. If result is POSITIVE SARS-CoV-2 target nucleic acids are DETECTED. The SARS-CoV-2 RNA is generally detectable in upper and lower  respiratory specimens dur ing the acute phase of infection.  Positive  results are indicative of active infection with SARS-CoV-2.  Clinical  correlation with patient history and other diagnostic information is  necessary to determine patient infection status.  Positive results do  not rule out bacterial infection or co-infection with other viruses. If result is PRESUMPTIVE POSTIVE SARS-CoV-2 nucleic acids MAY BE PRESENT.   A presumptive positive  result was obtained on the submitted specimen  and confirmed on repeat testing.  While 2019 novel coronavirus  (SARS-CoV-2) nucleic acids may be present in the submitted sample  additional confirmatory testing may be necessary for epidemiological  and / or clinical management purposes  to differentiate between  SARS-CoV-2  and other Sarbecovirus currently known to infect humans.  If clinically indicated additional testing with an alternate test  methodology [phone]([id number]) is advised. The SARS-CoV-2 RNA is generally  detectable in upper and lower respiratory sp ecimens during the acute  phase of infection. The expected result is Negative. Fact Sheet for Patients:  BoilerBrush.com.cyhttps://www.fda.gov/media/136312/download Fact Sheet for Healthcare Providers: https://pope.com/https://www.fda.gov/media/136313/download This test is not yet approved or cleared by the [place] FDA and has been authorized for detection and/or diagnosis of SARS-CoV-2 by FDA under an Emergency Use Authorization (EUA).  This EUA will remain in effect (meaning this test can be used) for the duration of the COVID-19 declaration under Section 564(b)(1) of the Act, 21 U.S.C. section 360bbb-3(b)(1), unless the authorization is terminated or revoked sooner. Performed at [facility], [address] [place], [place] [address]   CBC with Differential     Status: Abnormal   Collection Time: 11/02/18 11:57 AM  Result Value Ref Range   WBC 7.0 4.0 - 10.5 K/uL   RBC 3.87 3.87 - 5.11 MIL/uL   Hemoglobin 11.6 (L) 12.0 - 15.0 g/dL   HCT 62.936.2 52.836.0 - 41.346.0 %   MCV 93.5 80.0 - 100.0 fL   MCH 30.0 26.0 - 34.0 pg   MCHC 32.0 30.0 - 36.0 g/dL   RDW 24.413.1 01.011.5 - 27.215.5 %   Platelets 418 (H) 150 - 400 K/uL   nRBC 0.0 0.0 - 0.2 %   Neutrophils Relative % 60 %   Neutro Abs 4.2 1.7 - 7.7 K/uL   Lymphocytes Relative 31 %   Lymphs Abs 2.2 0.7 - 4.0 K/uL   Monocytes Relative 6 %   Monocytes Absolute 0.4 0.1 - 1.0 K/uL   Eosinophils Relative 2 %   Eosinophils Absolute 0.1 0.0 - 0.5 K/uL   Basophils Relative 1 %   Basophils Absolute 0.1 0.0 - 0.1 K/uL   Immature Granulocytes 0 %   Abs Immature Granulocytes 0.02 0.00 - 0.07 K/uL    Comment: Performed at [facility], [address] [place], [place] [address]  Basic metabolic panel     Status: None   Collection  Time: 11/02/18 11:57 AM  Result Value Ref Range   Sodium 138 135 - 145 mmol/L   Potassium 4.4 3.5 - 5.1 mmol/L   Chloride 104 98 - 111 mmol/L   CO2 29 22 - 32 mmol/L   Glucose, Bld 92 70 - 99 mg/dL   BUN 22 8 - 23 mg/dL   Creatinine, Ser 4.030.91 0.44 - 1.00 mg/dL   Calcium 9.2 8.9 - 47.410.3 mg/dL   GFR calc non Af Amer >60 >60 mL/min   GFR calc Af Amer >60 >60 mL/min   Anion gap 5 5 - 15    Comment: Performed at [facility], [address] [place], [place] [address]   [place] Venous Img Lower Unilateral Left  Result Date: 11/02/2018 CLINICAL DATA:  Left lower extremity pain and edema for the past 2 weeks. Evaluate for DVT. EXAM: LEFT LOWER EXTREMITY VENOUS DOPPLER ULTRASOUND TECHNIQUE: Gray-scale sonography with graded compression, as well as color Doppler and duplex ultrasound were performed to evaluate the lower extremity deep venous systems from  the level of the common femoral vein and including the common femoral, femoral, profunda femoral, popliteal and calf veins including the posterior tibial, peroneal and gastrocnemius veins when visible. The superficial great saphenous vein was also interrogated. Spectral Doppler was utilized to evaluate flow at rest and with distal augmentation maneuvers in the common femoral, femoral and popliteal veins. COMPARISON:  Left lower extremity venous Doppler ultrasound-10/21/2018 FINDINGS: Contralateral Common Femoral Vein: Respiratory phasicity is normal and symmetric with the symptomatic side. No evidence of thrombus. Normal compressibility. Common Femoral Vein: No evidence of thrombus. Normal compressibility, respiratory phasicity and response to augmentation. Saphenofemoral Junction: No evidence of thrombus. Normal compressibility and flow on color Doppler imaging. Profunda Femoral Vein: No evidence of thrombus. Normal compressibility and flow on color Doppler imaging. Femoral Vein: No evidence of thrombus. Normal compressibility, respiratory phasicity and  response to augmentation. Popliteal Vein: No evidence of thrombus. Normal compressibility, respiratory phasicity and response to augmentation. Calf Veins: No evidence of thrombus. Normal compressibility and flow on color Doppler imaging. Superficial Great Saphenous Vein: No evidence of thrombus. Normal compressibility. Venous Reflux:  None. Other Findings: Incidental note made of an approximately 4.0 x 2.5 x 1.2 cm serpiginous anechoic fluid collection with the left popliteal fossa compatible with a Baker cyst, grossly unchanged compared to the 10/21/2018 examination, previously, 4.7 x 2.6 x 1.1 cm. IMPRESSION: 1. No evidence of DVT within the left lower extremity. 2. 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 Electronically Signed   By: Sandi Mariscal M.D.   On: 11/02/2018 13:49    Pending Labs Unresulted Labs (From admission, onward)    Start     Ordered   11/03/18 0500  CBC  Tomorrow morning,   STAT     11/02/18 1302   11/03/18 1751  Basic metabolic panel  Tomorrow morning,   STAT     11/02/18 1302   11/02/18 1123  Culture, blood (routine x 2)  BLOOD CULTURE X 2,   STAT     11/02/18 1123          Vitals/Pain Today's Vitals   11/02/18 1030 11/02/18 1101 11/02/18 1102 11/02/18 1206  BP:  (!) 129/92    Pulse:   64   Resp:      Temp:      TempSrc:      SpO2:   100%   Weight: 89.8 kg     Height: 5\' 7"  (1.702 m)     PainSc: 1    0-No pain    Isolation Precautions No active isolations  Medications Medications  enoxaparin (LOVENOX) injection 40 mg (has no administration in time range)  vancomycin (VANCOCIN) 2,000 mg in sodium chloride 0.9 % 500 mL IVPB (has no administration in time range)  ceFEPIme (MAXIPIME) 2 g in sodium chloride 0.9 % 100 mL IVPB (has no administration in time range)    Mobility walks Low fall risk   Focused Assessments Cardiac Assessment Handoff:    No results found for: CKTOTAL, CKMB, CKMBINDEX, TROPONINI No results found for:  DDIMER Does the Patient currently have chest pain? No   A&Ox4, very pleasant. Ambulatory without assistance. LLE +4 pitting edema and redness. Recently admitted for same - pt states after admission cellulitis had subsided but now is worse again. 4cm Baker's cyst present as well to LLE.    R Recommendations: See Admitting Provider Note  Report given to:   Additional Notes:

## 2018-11-02 NOTE — Progress Notes (Signed)
Pharmacy Antibiotic Note  Marissa Hayes is a 63 y.o. female admitted on 11/02/2018 with cellulitis.  Pharmacy has been consulted for Vancomycin and Cefepime dosing. Patient was recently admitted for same and discharged home on Clindamycin, now with worsening symptoms.  Plan: Vancomycin 2000mg  IV loading dose followed by  Vancomycin 1250 mg IV Q 24 hrs. Goal AUC 400-550. Expected AUC: 514.2 SCr used: 0.91 Excpected Cmin: 11.3  Cefepime 2g IV q8h    Height: 5\' 7"  (170.2 cm) Weight: 198 lb (89.8 kg) IBW/kg (Calculated) : 61.6  Temp (24hrs), Avg:98.4 F (36.9 C), Min:98.4 F (36.9 C), Max:98.4 F (36.9 C)  Recent Labs  Lab 11/02/18 1157  WBC 7.0  CREATININE 0.91    Estimated Creatinine Clearance: 70.9 mL/min (by C-G formula based on SCr of 0.91 mg/dL).    No Known Allergies  Antimicrobials this admission: Vancomycin 8/17 >>  Cefepime 8/17 >>   Thank you for allowing pharmacy to be a part of this patient's care.  Paulina Fusi, PharmD, BCPS 11/02/2018 3:16 PM

## 2018-11-02 NOTE — Telephone Encounter (Signed)
Copied from [place] [id number]. Topic: Appointment Scheduling - Scheduling Inquiry for Clinic >> Nov 02, 2018 10:22 AM Scherrie Gerlach wrote: Reason for CRM: pt states she is having cellulitis issues and had to be out of work this past week. Now today it is swollen back up and she is on the way back and she is needing an earlier appt than 11/13/18 for a new pt, because she needs FMLA paperwork filled out.  Pt wants to know if she can be seen sooner.

## 2018-11-03 LAB — BASIC METABOLIC PANEL
Anion gap: 7 (ref 5–15)
BUN: 18 mg/dL (ref 8–23)
CO2: 25 mmol/L (ref 22–32)
Calcium: 8.5 mg/dL — ABNORMAL LOW (ref 8.9–10.3)
Chloride: 105 mmol/L (ref 98–111)
Creatinine, Ser: 1 mg/dL (ref 0.44–1.00)
GFR calc Af Amer: >60 mL/min (ref >60)
GFR calc non Af Amer: 59 mL/min — ABNORMAL LOW (ref >60)
Glucose, Bld: 126 mg/dL — ABNORMAL HIGH (ref 70–99)
Potassium: 4.1 mmol/L (ref 3.5–5.1)
Sodium: 137 mmol/L (ref 135–145)

## 2018-11-03 LAB — CBC
HCT: 33.6 % — ABNORMAL LOW (ref 36.0–46.0)
Hemoglobin: 10.9 g/dL — ABNORMAL LOW (ref 12.0–15.0)
MCH: 30.2 pg (ref 26.0–34.0)
MCHC: 32.4 g/dL (ref 30.0–36.0)
MCV: 93.1 fL (ref 80.0–100.0)
Platelets: 380 10*3/uL (ref 150–400)
RBC: 3.61 MIL/uL — ABNORMAL LOW (ref 3.87–5.11)
RDW: 13.2 % (ref 11.5–15.5)
WBC: 6.5 10*3/uL (ref 4.0–10.5)
nRBC: 0 % (ref 0.0–0.2)

## 2018-11-03 MED ORDER — VANCOMYCIN HCL IN DEXTROSE 1-5 GM/200ML-% IV SOLN
1000.0000 mg | INTRAVENOUS | Status: DC
  Administered 2018-11-03: 1000 mg via INTRAVENOUS
  Filled 2018-11-03 (×2): qty 200

## 2018-11-03 NOTE — TOC Progression Note (Signed)
Transition of Care [facility]) - Progression Note    Patient Details  Name: Marissa Hayes MRN: [id number] Date of Birth: November 27, 1955  Transition of Care [facility]) CM/SW [place], RN Phone Number: 11/03/2018, 11:22 AM  Clinical Narrative:    Patient has no insurance provided her with an [facility] and sent the referral via email to Lurena Nida,        Expected Discharge Plan and Services           Expected Discharge Date: 11/04/18                                     Social Determinants of Health (SDOH) Interventions    Readmission Risk Interventions No flowsheet data found.

## 2018-11-03 NOTE — Progress Notes (Signed)
[facility] [place] NAME: Marissa Hayes    MR#:  [id number]  DATE OF BIRTH:  Sep 22, 1955  SUBJECTIVE:   patient says that cellulitis and edema has improved.  She only came back to the emergency room because her leg was still swollen and she was concerned.  She is able to walk on this.  She has had no fever since her discharge from the hospital.    REVIEW OF SYSTEMS:    Review of Systems  Constitutional: Negative for fever, chills weight loss HENT: Negative for ear pain, nosebleeds, congestion, facial swelling, rhinorrhea, neck pain, neck stiffness and ear discharge.   Respiratory: Negative for cough, shortness of breath, wheezing  Cardiovascular: Negative for chest pain, palpitations and leg swelling.  Gastrointestinal: Negative for heartburn, abdominal pain, vomiting, diarrhea or consitpation Genitourinary: Negative for dysuria, urgency, frequency, hematuria Musculoskeletal: Negative for back pain or joint pain Neurological: Negative for dizziness, seizures, syncope, focal weakness,  numbness and headaches.  Hematological: Does not bruise/bleed easily.  Psychiatric/Behavioral: Negative for hallucinations, confusion, dysphoric mood  Skin: Erythema has improved swelling has improved Tolerating Diet: yes      DRUG ALLERGIES:  No Known Allergies  VITALS:  Blood pressure 117/79, pulse 63, temperature 98.3 F (36.8 C), resp. rate 18, height 5\' 7"  (1.702 m), weight 89.8 kg, SpO2 99 %.  PHYSICAL EXAMINATION:  Constitutional: Appears well-developed and well-nourished. No distress. HENT: Normocephalic. Marland Kitchen Oropharynx is clear and moist.  Eyes: Conjunctivae and EOM are normal. PERRLA, no scleral icterus.  Neck: Normal ROM. Neck supple. No JVD. No tracheal deviation. CVS: RRR, S1/S2 +, no murmurs, no gallops, no carotid bruit.  Pulmonary: Effort and breath sounds normal, no stridor, rhonchi, wheezes, rales.  Abdominal: Soft. BS +,  no distension,  tenderness, rebound or guarding.  Musculoskeletal: Normal range of motion. No edema and no tenderness.  Neuro: Alert. CN 2-12 grossly intact. No focal deficits. Skin: Skin is warm and dry. No rash noted. Psychiatric: Normal mood and affect.    Left leg cellulits has actually much improved since her discharge as has swelling she is able to move her ankles and bear weight   LABORATORY PANEL:   CBC Recent Labs  Lab 11/03/18 0429  WBC 6.5  HGB 10.9*  HCT 33.6*  PLT 380   ------------------------------------------------------------------------------------------------------------------  Chemistries  Recent Labs  Lab 11/03/18 0429  NA 137  K 4.1  CL 105  CO2 25  GLUCOSE 126*  BUN 18  CREATININE 1.00  CALCIUM 8.5*   ------------------------------------------------------------------------------------------------------------------  Cardiac Enzymes No results for input(s): TROPONINI in the last 168 hours. ------------------------------------------------------------------------------------------------------------------  RADIOLOGY:  US Venous Img Lower Unilateral Left  Result Date: 11/02/2018 CLINICAL DATA:  Left lower extremity pain and edema for the past 2 weeks. Evaluate for DVT. EXAM: LEFT LOWER EXTREMITY VENOUS DOPPLER ULTRASOUND TECHNIQUE: Gray-scale sonography with graded compression, as well as color Doppler and duplex ultrasound were performed to evaluate the lower extremity deep venous systems from the level of the common femoral vein and including the common femoral, femoral, profunda femoral, popliteal and calf veins including the posterior tibial, peroneal and gastrocnemius veins when visible. The superficial great saphenous vein was also interrogated. Spectral Doppler was utilized to evaluate flow at rest and with distal augmentation maneuvers in the common femoral, femoral and popliteal veins. COMPARISON:  Left lower extremity venous Doppler ultrasound-10/21/2018  FINDINGS: Contralateral Common Femoral Vein: Respiratory phasicity is normal and symmetric with the symptomatic side. No evidence of thrombus.  Normal compressibility. Common Femoral Vein: No evidence of thrombus. Normal compressibility, respiratory phasicity and response to augmentation. Saphenofemoral Junction: No evidence of thrombus. Normal compressibility and flow on color Doppler imaging. Profunda Femoral Vein: No evidence of thrombus. Normal compressibility and flow on color Doppler imaging. Femoral Vein: No evidence of thrombus. Normal compressibility, respiratory phasicity and response to augmentation. Popliteal Vein: No evidence of thrombus. Normal compressibility, respiratory phasicity and response to augmentation. Calf Veins: No evidence of thrombus. Normal compressibility and flow on color Doppler imaging. Superficial Great Saphenous Vein: No evidence of thrombus. Normal compressibility. Venous Reflux:  None. Other Findings: Incidental note made of an approximately 4.0 x 2.5 x 1.2 cm serpiginous anechoic fluid collection with the left popliteal fossa compatible with a Baker cyst, grossly unchanged compared to the 10/21/2018 examination, previously, 4.7 x 2.6 x 1.1 cm. IMPRESSION: 1. No evidence of DVT within the left lower extremity. 2. 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 Electronically Signed   By: Simonne ComeJohn  Watts M.D.   On: 11/02/2018 13:49     ASSESSMENT AND PLAN:   63 year old female who presented to the emergency room due to left leg swelling and pain.  1. Left lower extremity cellulitis:This has acutually improved since her discharge from previous hospital stay. She came back to ED as she was supposed to go to work and it was still swollen so she was worried. She is able to place weight on her leg. Her CT last stay showed no abcsess. Dopplers are negative for DVT.  Continue IV ABX for another 24 hours. She should be able to go back home tomorrow on Clindamycin and  I can add doxycycline. Elevate leg on 3 pillows   D/w nursing   Management plans discussed with the patient and she is in agreement.  CODE STATUS: full  TOTAL TIME TAKING CARE OF THIS PATIENT: 30minutes.     POSSIBLE D/C tomorrow, DEPENDING ON CLINICAL CONDITION.   Adrian SaranSital Aashrith Eves M.D on 11/03/2018 at 11:09 AM  Between 7am to 6pm - Pager - [phone] After 6pm go to [email or web address] - password [email or web address]  [facility] [place] [facility]  Office  [phone]  CC: Primary care physician; Patient, No Pcp Per  Note: This dictation was prepared with Dragon dictation along with smaller phrase technology. Any transcriptional errors that result from this process are unintentional.

## 2018-11-03 NOTE — Progress Notes (Signed)
Pharmacy Antibiotic Note  Marissa Hayes is a 63 y.o. female admitted on 11/02/2018 with cellulitis.  Pharmacy has been consulted for Vancomycin and Cefepime dosing. Patient was recently admitted for same and discharged home on Clindamycin, now with worsening symptoms.  Plan: Vancomycin 2000mg  IV loading dose followed by   Previous dosing: Vancomycin 1250 mg IV Q 24 hrs. Goal AUC 400-550. Expected AUC: 514.2 SCr used: 0.91 Expected Cmin: 11.3  Newly adjusted dosing: Vancomycin 1000 mg IV Q 24 hrs. Goal AUC 400-550. Expected AUC: 448 SCr used: 1.0 Expected Cmin: 10.2  Continue Cefepime 2g IV q8h  Height: 5\' 7"  (170.2 cm) Weight: 198 lb (89.8 kg) IBW/kg (Calculated) : 61.6  Temp (24hrs), Avg:98.2 F (36.8 C), Min:97.8 F (36.6 C), Max:98.4 F (36.9 C)  Recent Labs  Lab 11/02/18 1157 11/03/18 0429  WBC 7.0 6.5  CREATININE 0.91 1.00    Estimated Creatinine Clearance: 64.5 mL/min (by C-G formula based on SCr of 1 mg/dL).    No Known Allergies  Antimicrobials this admission: Vancomycin 8/17 >>  Cefepime 8/17 >>   Thank you for allowing pharmacy to be a part of this patient's care.  Lu Duffel, PharmD, BCPS Clinical Pharmacist 11/03/2018 7:41 AM

## 2018-11-04 LAB — CREATININE, SERUM
Creatinine, Ser: 0.91 mg/dL (ref 0.44–1.00)
GFR calc Af Amer: >60 mL/min (ref >60)
GFR calc non Af Amer: >60 mL/min (ref >60)

## 2018-11-04 NOTE — Discharge Summary (Signed)
[facility] - [place] [facility]   PATIENT NAME: Marissa Hayes    MR#:  [id number]  DATE OF BIRTH:  03/04/1956  DATE OF ADMISSION:  11/02/2018 ADMITTING PHYSICIAN: Jimmye NormanElizabeth Achieng Ouma, NP  DATE OF DISCHARGE: 11/04/2018  PRIMARY CARE PHYSICIAN: Patient, No Pcp Per    ADMISSION DIAGNOSIS:  Cellulitis of left lower extremity [L03.116]  DISCHARGE DIAGNOSIS:  Active Problems:   Cellulitis of left leg   SECONDARY DIAGNOSIS:  none  HOSPITAL COURSE:   63 year old female who presented to the emergency room due to left leg swelling and pain.  1. Left lower extremity cellulitis:This has acutually improved since her discharge from previous hospital stay. She came back to ED as she was supposed to go to work and it was still swollen so she was worried. She is able to place weight on her leg. Her CT last stay showed no abcsess. Dopplers are negative for DVT.  Her leg looks much better today.  She will continue on clindamycin which she was discharged with previously.  DISCHARGE CONDITIONS AND DIET:   Stable for discharge regular diet  CONSULTS OBTAINED:    DRUG ALLERGIES:  No Known Allergies  DISCHARGE MEDICATIONS:   Allergies as of 11/04/2018   No Known Allergies     Medication List    TAKE these medications   clindamycin 300 MG capsule Commonly known as: CLEOCIN Take 1 capsule (300 mg total) by mouth every 8 (eight) hours for 5 days.         Today   CHIEF COMPLAINT:  Patient is doing well and ready to be discharged home   VITAL SIGNS:  Blood pressure 124/68, pulse 66, temperature 98.2 F (36.8 C), temperature source Oral, resp. rate 16, height 5\' 7"  (1.702 m), weight 89.8 kg, SpO2 98 %.   REVIEW OF SYSTEMS:  Review of Systems  Constitutional: Negative.  Negative for chills, fever and malaise/fatigue.  HENT: Negative.  Negative for ear discharge, ear pain, hearing loss, nosebleeds and sore throat.   Eyes: Negative.  Negative for blurred  vision and pain.  Respiratory: Negative.  Negative for cough, hemoptysis, shortness of breath and wheezing.   Cardiovascular: Negative.  Negative for chest pain, palpitations and leg swelling.  Gastrointestinal: Negative.  Negative for abdominal pain, blood in stool, diarrhea, nausea and vomiting.  Genitourinary: Negative.  Negative for dysuria.  Musculoskeletal: Negative.  Negative for back pain.  Skin:       Better edema and cellultiis  Neurological: Negative for dizziness, tremors, speech change, focal weakness, seizures and headaches.  Endo/Heme/Allergies: Negative.  Does not bruise/bleed easily.  Psychiatric/Behavioral: Negative.  Negative for depression, hallucinations and suicidal ideas.     PHYSICAL EXAMINATION:  GENERAL:  63 y.o.-year-old patient lying in the bed with no acute distress.  NECK:  Supple, no jugular venous distention. No thyroid enlargement, no tenderness.  LUNGS: Normal breath sounds bilaterally, no wheezing, rales,rhonchi  No use of accessory muscles of respiration.  CARDIOVASCULAR: S1, S2 normal. No murmurs, rubs, or gallops.  ABDOMEN: Soft, non-tender, non-distended. Bowel sounds present. No organomegaly or mass.  EXTREMITIES: No pedal edema, cyanosis, or clubbing.  PSYCHIATRIC: The patient is alert and oriented x 3.  SKIN: No obvious rash, lesion, or ulcer.  Left lower extremity with decreased swelling and no erythema much improved since yesterday DATA REVIEW:   CBC Recent Labs  Lab 11/03/18 0429  WBC 6.5  HGB 10.9*  HCT 33.6*  PLT 380    Chemistries  Recent Labs  Lab 11/03/18 0429 11/04/18 0422  NA 137  --   K 4.1  --   CL 105  --   CO2 25  --   GLUCOSE 126*  --   BUN 18  --   CREATININE 1.00 0.91  CALCIUM 8.5*  --     Cardiac Enzymes No results for input(s): TROPONINI in the last 168 hours.  Microbiology Results  @[email or web address] @  RADIOLOGY:  US Venous Img Lower Unilateral Left  Result Date: 11/02/2018 CLINICAL DATA:  Left lower  extremity pain and edema for the past 2 weeks. Evaluate for DVT. EXAM: LEFT LOWER EXTREMITY VENOUS DOPPLER ULTRASOUND TECHNIQUE: Gray-scale sonography with graded compression, as well as color Doppler and duplex ultrasound were performed to evaluate the lower extremity deep venous systems from the level of the common femoral vein and including the common femoral, femoral, profunda femoral, popliteal and calf veins including the posterior tibial, peroneal and gastrocnemius veins when visible. The superficial great saphenous vein was also interrogated. Spectral Doppler was utilized to evaluate flow at rest and with distal augmentation maneuvers in the common femoral, femoral and popliteal veins. COMPARISON:  Left lower extremity venous Doppler ultrasound-10/21/2018 FINDINGS: Contralateral Common Femoral Vein: Respiratory phasicity is normal and symmetric with the symptomatic side. No evidence of thrombus. Normal compressibility. Common Femoral Vein: No evidence of thrombus. Normal compressibility, respiratory phasicity and response to augmentation. Saphenofemoral Junction: No evidence of thrombus. Normal compressibility and flow on color Doppler imaging. Profunda Femoral Vein: No evidence of thrombus. Normal compressibility and flow on color Doppler imaging. Femoral Vein: No evidence of thrombus. Normal compressibility, respiratory phasicity and response to augmentation. Popliteal Vein: No evidence of thrombus. Normal compressibility, respiratory phasicity and response to augmentation. Calf Veins: No evidence of thrombus. Normal compressibility and flow on color Doppler imaging. Superficial Great Saphenous Vein: No evidence of thrombus. Normal compressibility. Venous Reflux:  None. Other Findings: Incidental note made of an approximately 4.0 x 2.5 x 1.2 cm serpiginous anechoic fluid collection with the left popliteal fossa compatible with a Baker cyst, grossly unchanged compared to the 10/21/2018 examination,  previously, 4.7 x 2.6 x 1.1 cm. IMPRESSION: 1. No evidence of DVT within the left lower extremity. 2. 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 Electronically Signed   By: Sandi Mariscal M.D.   On: 11/02/2018 13:49      Allergies as of 11/04/2018   No Known Allergies     Medication List    TAKE these medications   clindamycin 300 MG capsule Commonly known as: CLEOCIN Take 1 capsule (300 mg total) by mouth every 8 (eight) hours for 5 days.         Management plans discussed with the patient and she is in agreement. Stable for discharge home  [facility]  CODE STATUS:     Code Status Orders  (From admission, onward)         Start     Ordered   11/02/18 1302  Full code  Continuous     11/02/18 1302        Code Status History    Date Active Date Inactive Code Status Order ID Comments User Context   10/21/2018 2058 10/24/2018 1459 Full Code [id number]  Saundra Shelling, MD Inpatient   Advance Care Planning Activity      TOTAL TIME TAKING CARE OF THIS PATIENT: 38 minutes.    Note: This dictation was prepared with Dragon dictation along with smaller phrase technology. Any transcriptional  errors that result from this process are unintentional.  Adrian SaranSital Tehillah Cipriani M.D on 11/04/2018 at 11:06 AM  Between 7am to 6pm - Pager - [phone] After 6pm go to [email or web address] - password [email or web address]  [facility]  Office  [phone]  CC: Primary care physician; Patient, No Pcp Per

## 2018-11-07 LAB — CULTURE, BLOOD (ROUTINE X 2)
Culture: NO GROWTH
Culture: NO GROWTH

## 2018-11-12 ENCOUNTER — Other Ambulatory Visit: Payer: Self-pay

## 2018-11-13 ENCOUNTER — Encounter: Payer: Self-pay | Admitting: Family Medicine

## 2018-11-13 ENCOUNTER — Ambulatory Visit (INDEPENDENT_AMBULATORY_CARE_PROVIDER_SITE_OTHER): Payer: Self-pay | Admitting: Family Medicine

## 2018-11-13 ENCOUNTER — Ambulatory Visit: Payer: Self-pay | Admitting: Internal Medicine

## 2018-11-13 ENCOUNTER — Other Ambulatory Visit: Payer: Self-pay

## 2018-11-13 VITALS — BP 128/76 | HR 72 | Temp 96.7°F | Resp 16 | Ht 66.0 in | Wt 220.0 lb

## 2018-11-13 DIAGNOSIS — L03116 Cellulitis of left lower limb: Secondary | ICD-10-CM

## 2018-11-13 DIAGNOSIS — M7989 Other specified soft tissue disorders: Secondary | ICD-10-CM

## 2018-11-13 DIAGNOSIS — L609 Nail disorder, unspecified: Secondary | ICD-10-CM

## 2018-11-13 MED ORDER — FUROSEMIDE 20 MG PO TABS: 20.0000 mg | ORAL_TABLET | Freq: Every day | ORAL | 3 refills | Status: DC | PRN

## 2018-11-13 NOTE — Progress Notes (Signed)
Subjective:    Patient ID: Marissa Hayes, female    DOB: November 20, 1955, 63 y.o.   MRN: [id number]  HPI   Patient presents to clinic to establish with PCP.  She was recently in the hospital due to a left lower extremity cellulitis and swelling.  Overall she is recovered well.  Still does get some swelling in the left lower extremity, and has been wearing a tall compression stocking on the left side which does seem to help.  Does have times where that by the end of the day left leg will still be swollen and a little sore, usually does respond to some elevation.  Past medical, social, surgical family history reviewed and updated in chart.  Recent hospital admission reviewed via epic.  Due to recently having a lot of blood work in the hospital, declines any lab work today.  CBC Latest Ref Rng & Units 11/03/2018 11/02/2018 10/23/2018  WBC 4.0 - 10.5 K/uL 6.5 7.0 12.7(H)  Hemoglobin 12.0 - 15.0 g/dL 10.9(L) 11.6(L) 10.9(L)  Hematocrit 36.0 - 46.0 % 33.6(L) 36.2 33.5(L)  Platelets 150 - 400 K/uL 380 418(H) 162   . BMP Latest Ref Rng & Units 11/04/2018 11/03/2018 11/02/2018  Glucose 70 - 99 mg/dL - 126(H) 92  BUN 8 - 23 mg/dL - 18 22  Creatinine 0.44 - 1.00 mg/dL 0.91 1.00 0.91  Sodium 135 - 145 mmol/L - 137 138  Potassium 3.5 - 5.1 mmol/L - 4.1 4.4  Chloride 98 - 111 mmol/L - 105 104  CO2 22 - 32 mmol/L - 25 29  Calcium 8.9 - 10.3 mg/dL - 8.5(L) 9.2   Lab Results  Component Value Date   HGBA1C 5.6 10/22/2018   History reviewed. No pertinent past medical history.  Patient Active Problem List   Diagnosis Date Noted  . Cellulitis of left leg 11/02/2018  . Cellulitis 10/21/2018   Social History   Tobacco Use  . Smoking status: Never Smoker  . Smokeless tobacco: Never Used  Substance Use Topics  . Alcohol use: Never    Frequency: Never   Past Surgical History:  Procedure Laterality Date  . none     History reviewed. No pertinent family history.   Review of Systems  Constitutional:  Negative for chills, fatigue and fever.  HENT: Negative for congestion, ear pain, sinus pain and sore throat.   Eyes: Negative.   Respiratory: Negative for cough, shortness of breath and wheezing.   Cardiovascular: Negative for chest pain, palpitations. +LLE swelling Gastrointestinal: Negative for abdominal pain, diarrhea, nausea and vomiting.  Genitourinary: Negative for dysuria, frequency and urgency.  Musculoskeletal: Negative for arthralgias and myalgias.  Skin: Negative for color change, pallor and rash.  Neurological: Negative for syncope, light-headedness and headaches.  Psychiatric/Behavioral: The patient is not nervous/anxious.       Objective:   Physical Exam Vitals signs and nursing note reviewed.  Constitutional:      General: She is not in acute distress.    Appearance: She is obese. She is not ill-appearing, toxic-appearing or diaphoretic.  HENT:     Head: Normocephalic and atraumatic.  Eyes:     General: No scleral icterus.    Extraocular Movements: Extraocular movements intact.     Conjunctiva/sclera: Conjunctivae normal.     Pupils: Pupils are equal, round, and reactive to light.  Cardiovascular:     Rate and Rhythm: Normal rate and regular rhythm.     Heart sounds: Normal heart sounds.  Pulmonary:     Effort:  Pulmonary effort is normal.     Breath sounds: Normal breath sounds.  Musculoskeletal:     Comments: +1 edema LLE, trace edema RLE  Skin:    General: Skin is warm and dry.     Coloration: Skin is not jaundiced or pale.     Comments: Thick toenails bilat LEs Skin not hot or red, cellulitis has cleared up  Neurological:     Mental Status: She is alert and oriented to person, place, and time.  Psychiatric:        Mood and Affect: Mood normal.        Behavior: Behavior normal.        Thought Content: Thought content normal.        Judgment: Judgment normal.    Today's Vitals   11/13/18 1438  BP: 128/76  Pulse: 72  Resp: 16  Temp: (!) 96.7 F  (35.9 C)  TempSrc: Temporal  SpO2: 96%  Weight: 220 lb (99.8 kg)  Height: 5\' 6"  (1.676 m)   Body mass index is 35.51 kg/m.     Assessment & Plan:    LLE swelling/cellulitlis left leg -- cellulitis is cleared up.  Some left lower extremity swelling still present, advised patient to wear compression stocking every day to help combat swelling & Keep leg elevated whenever able.  Thickened toenails - we will refer to podiatry for evaluation of toenails and trimming.  Advised patient that I would like to repeat lab work at some point soon, she is in process of getting her Medicare set up and will let us know once her Medicare is fully active we will get her lab work done and do a full wellness exam.

## 2018-11-20 ENCOUNTER — Telehealth: Payer: Self-pay | Admitting: Lab

## 2018-11-20 NOTE — Telephone Encounter (Signed)
Called Pt to pick up forms No answer, No VM. Will try back later

## 2018-12-14 ENCOUNTER — Telehealth: Payer: Self-pay | Admitting: Family Medicine

## 2018-12-14 NOTE — Telephone Encounter (Signed)
Patient is calling to check on the status of her FMLA forms that where brought in on 11/25/18. Please advise [phone] Patient is requesting the forms to be faxed. That are on the paperwork.

## 2018-12-14 NOTE — Telephone Encounter (Signed)
Called Pt back No answer No VM. Sent forms to fax number on the paper

## 2019-02-23 ENCOUNTER — Ambulatory Visit: Payer: Self-pay | Admitting: Family Medicine

## 2019-05-11 ENCOUNTER — Telehealth: Payer: Self-pay

## 2019-05-11 NOTE — Telephone Encounter (Signed)
I have paperwork for FMLA fro the patient dated back in September and I called to make sure she still needs the paperwork, Her PCP was L. Guse, her voicemail has not been setup.  I will put paperwork in L. Guse box.  Brycin Kille,cma

## 2019-05-17 ENCOUNTER — Ambulatory Visit: Payer: MEDICAID | Attending: Internal Medicine

## 2020-06-23 IMAGING — US VENOUS DOPPLER ULTRASOUND OF LEFT LOWER EXTREMITY
1 series · 13 of 24 positions shown · non-contrast
Comparison: Left lower extremity venous Doppler
ultrasound-10/21/2018

CLINICAL DATA: Left lower extremity pain and edema for the past 2
weeks. Evaluate for DVT.



[Series 1: venous doppler ultrasound of left lower extremity · 13 of 39 slices shown]
[im 1/39]
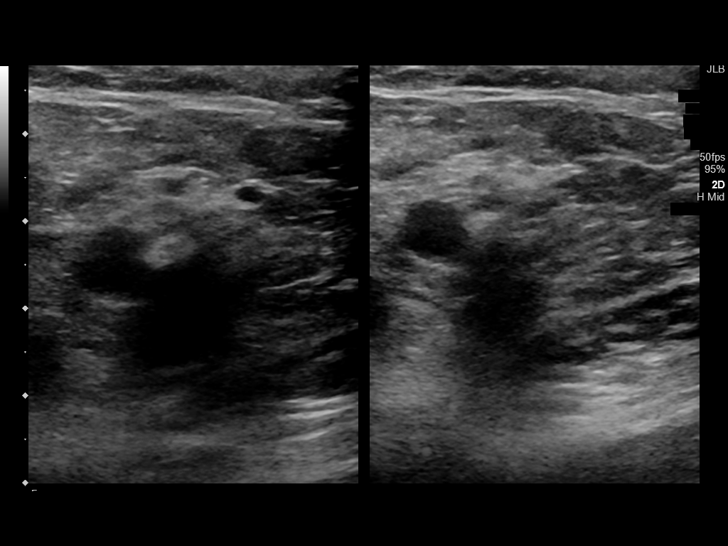
[im 4/39]
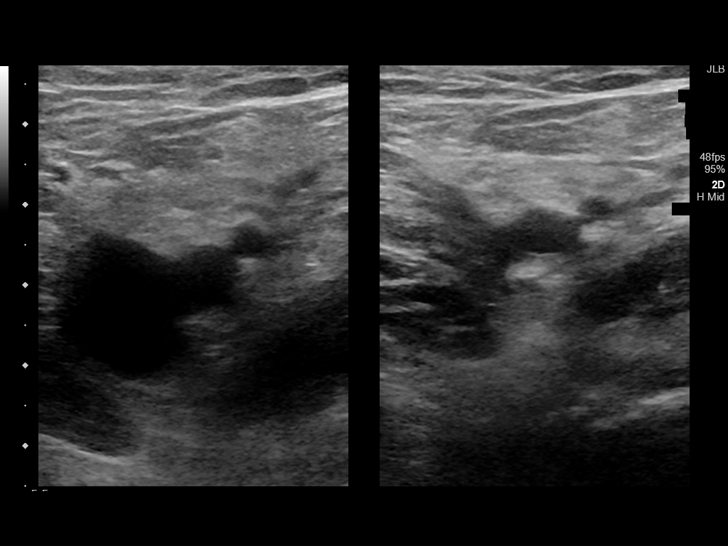
[im 7/39]
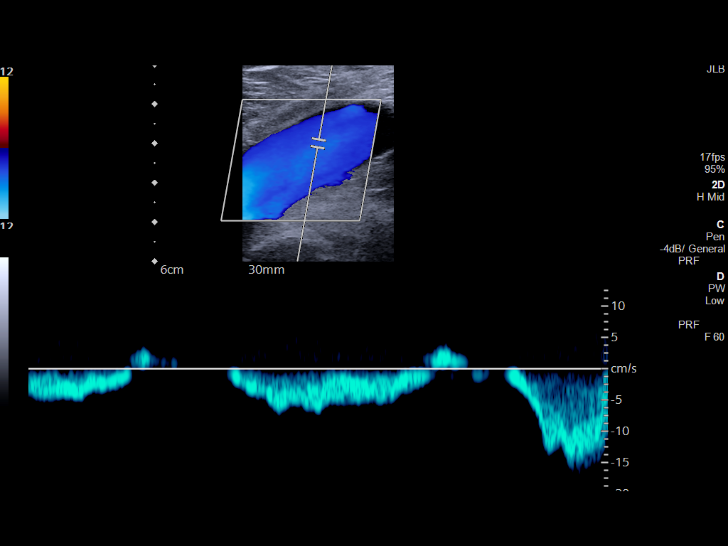
[im 10/39]
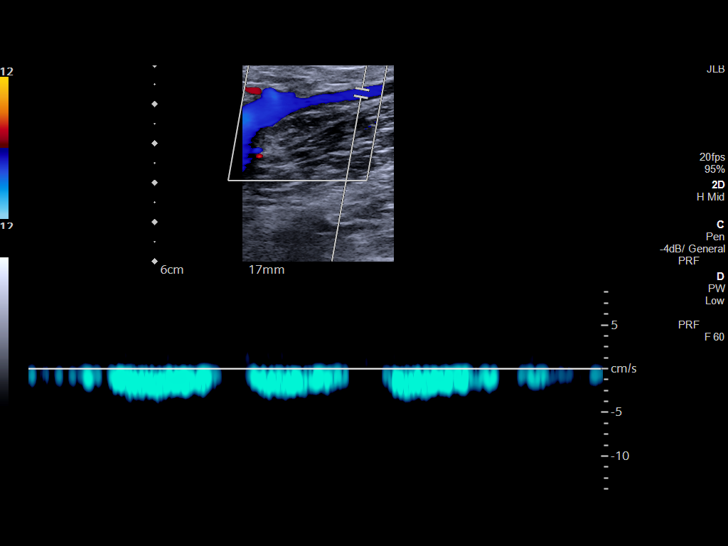
[im 14/39]
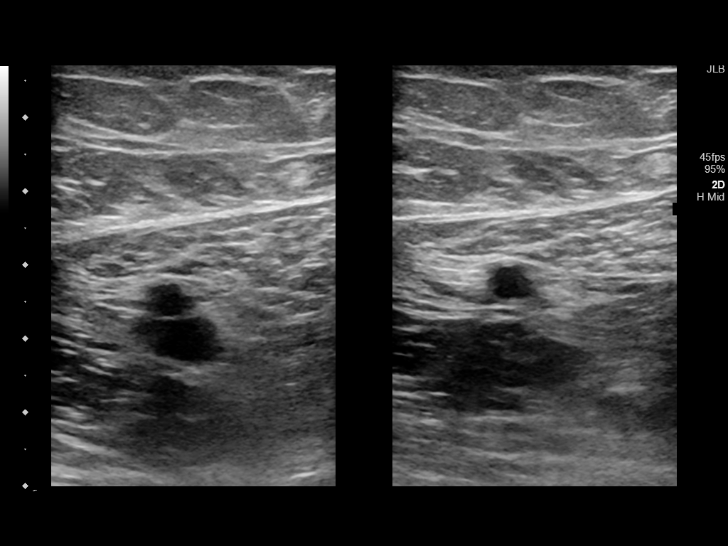
[im 17/39]
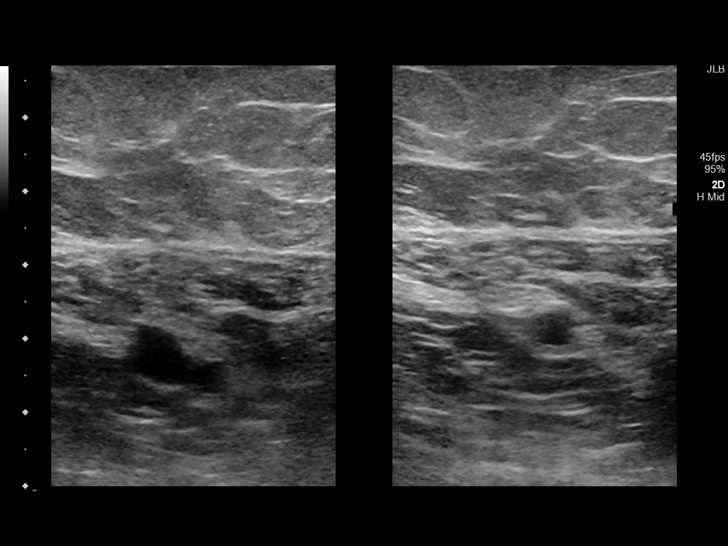
[im 20/39]
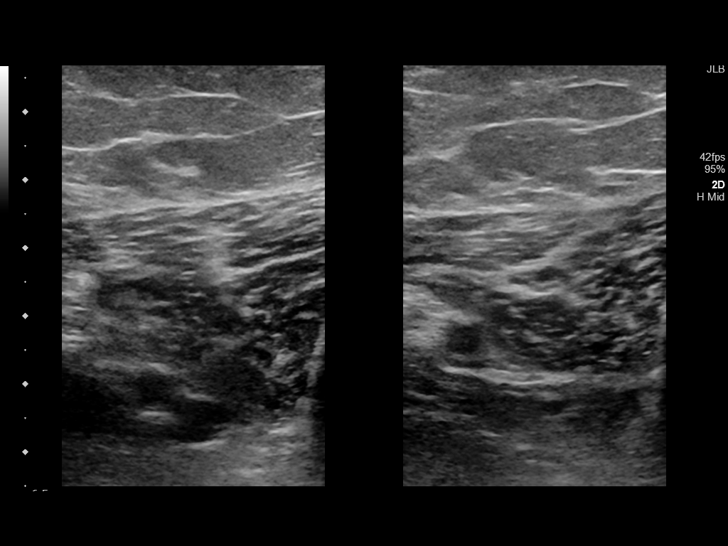
[im 22/39]
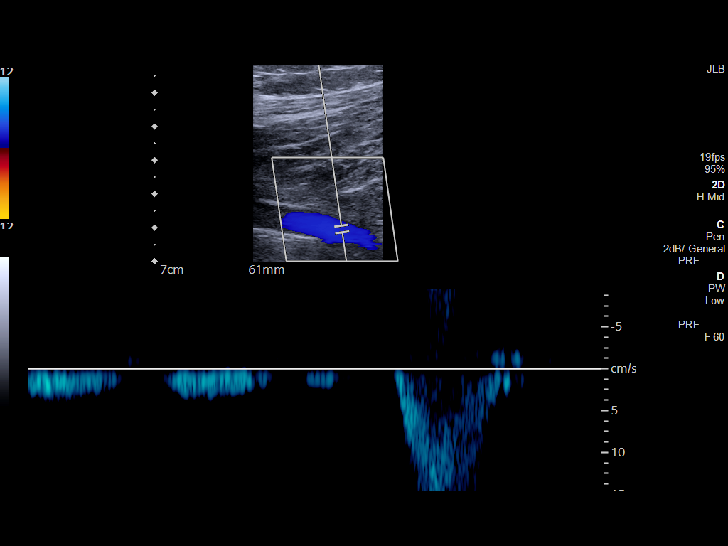
[im 25/39]
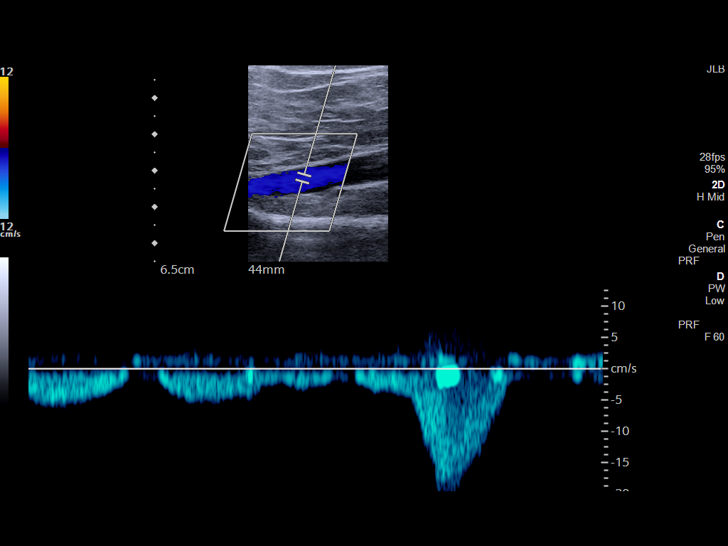
[im 29/39]
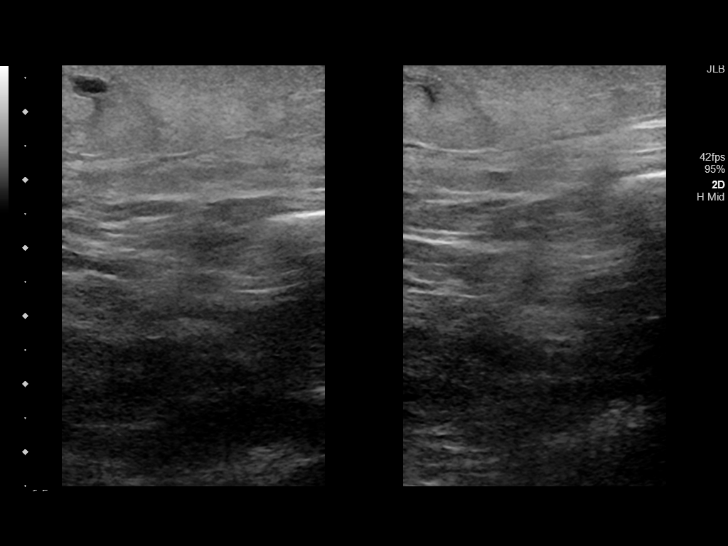
[im 32/39]
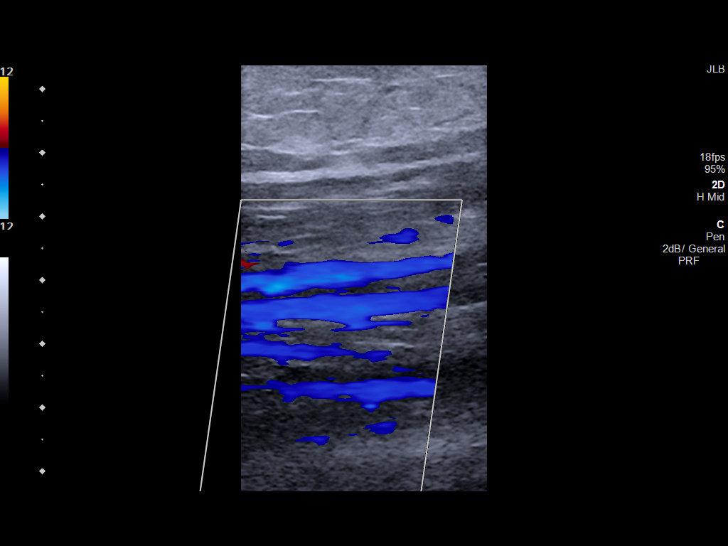
[im 35/39]
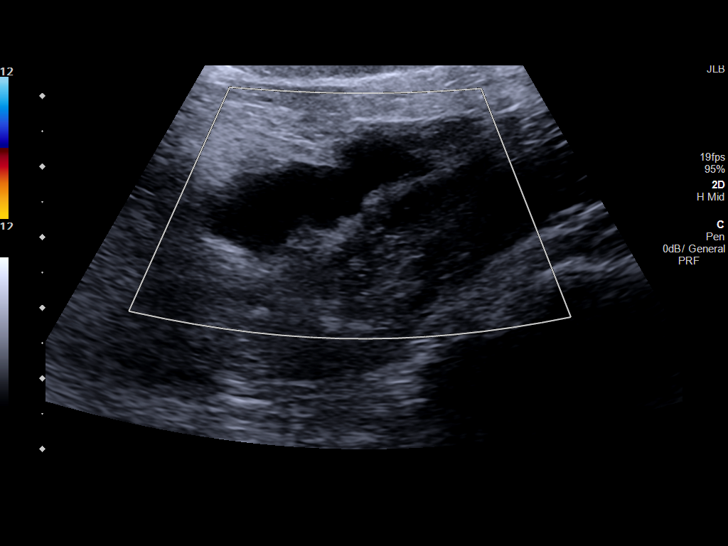
[im 39/39]
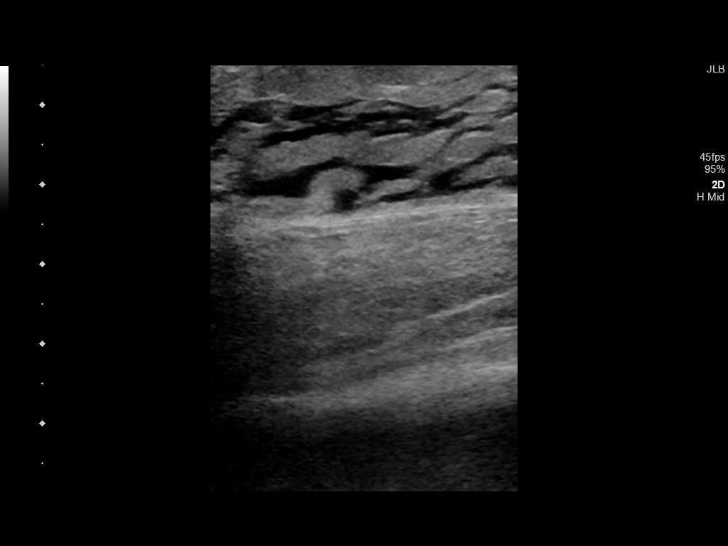

[13 of 24 positions shown; findings below may reference images not displayed]

FINDINGS: Contralateral Common Femoral Vein: Respiratory phasicity is normal
and symmetric with the symptomatic side. No evidence of thrombus.
Normal compressibility.

Common Femoral Vein: No evidence of thrombus. Normal
compressibility, respiratory phasicity and response to augmentation.

Saphenofemoral Junction: No evidence of thrombus. Normal
compressibility and flow on color Doppler imaging.

Profunda Femoral Vein: No evidence of thrombus. Normal
compressibility and flow on color Doppler imaging.

Femoral Vein: No evidence of thrombus. Normal compressibility,
respiratory phasicity and response to augmentation.

Popliteal Vein: No evidence of thrombus. Normal compressibility,
respiratory phasicity and response to augmentation.

Calf Veins: No evidence of thrombus. Normal compressibility and flow
on color Doppler imaging.

Superficial Great Saphenous Vein: No evidence of thrombus. Normal
compressibility.

Venous Reflux:  None.

Other Findings: Incidental note made of an approximately 4.0 x 2.5 x
1.2 cm serpiginous anechoic fluid collection with the left popliteal
fossa compatible with a Baker cyst, grossly unchanged compared to
the 10/21/2018 examination, previously, 4.7 x 2.6 x 1.1 cm.
IMPRESSION: 1. No evidence of DVT within the left lower extremity.
2. Grossly unchanged approximately 4 cm left-sided Baker's cyst.

## 2020-11-09 ENCOUNTER — Other Ambulatory Visit: Payer: Self-pay

## 2020-11-09 ENCOUNTER — Encounter: Payer: Self-pay | Admitting: *Deleted

## 2020-11-09 ENCOUNTER — Emergency Department: Payer: Medicare Other

## 2020-11-09 DIAGNOSIS — S299XXA Unspecified injury of thorax, initial encounter: Secondary | ICD-10-CM | POA: Diagnosis present

## 2020-11-09 DIAGNOSIS — S20211A Contusion of right front wall of thorax, initial encounter: Secondary | ICD-10-CM | POA: Diagnosis not present

## 2020-11-09 DIAGNOSIS — W010XXA Fall on same level from slipping, tripping and stumbling without subsequent striking against object, initial encounter: Secondary | ICD-10-CM | POA: Diagnosis not present

## 2020-11-09 NOTE — ED Triage Notes (Signed)
Pt says she tripped on something and fell onto her right side. Pain in the right lateral ribs, breath sounds are clear and equal

## 2020-11-10 ENCOUNTER — Emergency Department
Admission: EM | Admit: 2020-11-10 | Discharge: 2020-11-10 | Disposition: A | Discharge status: Home / Self Care | Payer: Medicare Other | Attending: Emergency Medicine | Admitting: Emergency Medicine

## 2020-11-10 DIAGNOSIS — W19XXXA Unspecified fall, initial encounter: Secondary | ICD-10-CM

## 2020-11-10 DIAGNOSIS — S20211A Contusion of right front wall of thorax, initial encounter: Secondary | ICD-10-CM

## 2020-11-10 MED ORDER — LIDOCAINE 5 % EX PTCH: 1.0000 | MEDICATED_PATCH | Freq: Two times a day (BID) | CUTANEOUS | 0 refills | Status: AC

## 2020-11-10 MED ORDER — LIDOCAINE 5 % EX PTCH
1.0000 | MEDICATED_PATCH | CUTANEOUS | Status: DC
  Administered 2020-11-10: 1 via TRANSDERMAL
  Filled 2020-11-10: qty 1

## 2020-11-10 MED ORDER — ACETAMINOPHEN 160 MG/5ML PO SOLN
650.0000 mg | Freq: Once | ORAL | Status: AC
  Administered 2020-11-10: 650 mg via ORAL
  Filled 2020-11-10: qty 20.3

## 2020-11-10 NOTE — Discharge Instructions (Addendum)
As we discussed, there is no evidence on the x-rays that you broke any ribs, and you declined getting a CT scan of the chest to take a closer look which we believe is fine since the management is the same.  It is important that you try taking over-the-counter ibuprofen and/or Tylenol to control the pain and also use the Lidoderm patches as prescribed which should also help.  Use the incentive spirometer you were provided to help you take deep breaths and clear the "bad air" out of your lungs to try and prevent developing pneumonia.  Return to the emergency department if you develop new or worsening symptoms that concern you.

## 2020-11-10 NOTE — ED Provider Notes (Signed)
[facility] Emergency Department Provider Note  ____________________________________________   Event Date/Time   First MD Initiated Contact with Patient 11/10/20 0115     (approximate)  I have reviewed the triage vital signs and the nursing notes.   HISTORY  Chief Complaint Fall    HPI Marissa Hayes is a 65 y.o. female who presents for evaluation of pain in the right sided anterior ribs after a fall.  She says she is got home from work around 8:30 PM when she tripped over something and landed on something (she thinks a piece of pressure) over her ribs.  It hurt a little bit of time in the event was acute in onset but the pain got worse as time went on so she felt that she get checked out.  It is worse when she pushes on it and when she takes a deep breath.  She is not having trouble breathing, just hurts to do so.  She denies striking her head or losing consciousness.  She has no neck pain or chest pain.  She denies abdominal pain.  She has no injuries to her arms nor her legs.  The pain is moderate and sharp.     History reviewed. No pertinent past medical history.  Patient Active Problem List   Diagnosis Date Noted   Cellulitis of left leg 11/02/2018   Cellulitis 10/21/2018    Past Surgical History:  Procedure Laterality Date   none      Prior to Admission medications   Medication Sig Start Date End Date Taking? Authorizing Provider  lidocaine (LIDODERM) 5 % Place 1 patch onto the skin every 12 (twelve) hours. Place the patch over the area on your ribs that is most painful. Remove & Discard patch within 12 hours or as directed by MD.  Wynelle Fanny the patch off for 12 hours before applying a new one. 11/10/20 11/10/21 Yes Loleta Rose, MD  furosemide (LASIX) 20 MG tablet Take 1 tablet (20 mg total) by mouth daily as needed. Use as needed for swelling in leg 11/13/18   Guse, Janna Arch, FNP    Allergies Patient has no known allergies.  No family history on  file.  Social History Social History   Tobacco Use   Smoking status: Never   Smokeless tobacco: Never  Vaping Use   Vaping Use: Never used  Substance Use Topics   Alcohol use: Never   Drug use: Never    Review of Systems Constitutional: No fever/chills Eyes: No visual changes. ENT: No sore throat. Cardiovascular: Denies chest pain. Respiratory: Denies shortness of breath. Gastrointestinal: No abdominal pain.  No nausea, no vomiting.  No diarrhea.  No constipation. Genitourinary: Negative for dysuria. Musculoskeletal: Right anterior rib pain after fall.  Denies neck pain.  Denies back pain. Integumentary: Negative for rash. Neurological: Negative for headaches, focal weakness or numbness.   ____________________________________________   PHYSICAL EXAM:  VITAL SIGNS: ED Triage Vitals [11/09/20 2208]  Enc Vitals Group     BP (!) 170/73     Pulse Rate 78     Resp 16     Temp 98.4 F (36.9 C)     Temp Source Oral     SpO2 98 %     Weight      Height      Head Circumference      Peak Flow      Pain Score 8     Pain Loc      Pain Edu?  Excl. in GC?     Constitutional: Alert and oriented.  Eyes: Conjunctivae are normal.  Head: Atraumatic. Nose: No congestion/rhinnorhea. Mouth/Throat: Patient is wearing a mask. Neck: No stridor.  No meningeal signs.   Cardiovascular: Normal rate, regular rhythm. Good peripheral circulation. Respiratory: Normal respiratory effort.  No retractions. Gastrointestinal: Soft and nontender. No distention.  Musculoskeletal: Patient is ambulatory without difficulty.  No obvious extremity injuries.  Highly reproducible right sided anterior rib pain over multiple ribs without any specific area of point tenderness.  No palpable deformities including no crepitus. Neurologic:  Normal speech and language. No gross focal neurologic deficits are appreciated.  Skin:  Skin is warm, dry and  intact.   ____________________________________________    RADIOLOGY I, Loleta Rose, personally viewed and evaluated these images (plain radiographs) as part of my medical decision making, as well as reviewing the written report by the radiologist.  ED MD interpretation:  No evidence of rib fracture  Official radiology report(s): DG Ribs Unilateral W/Chest Right  Result Date: 11/09/2020 CLINICAL DATA:  Recent fall with right-sided chest pain, initial encounter EXAM: RIGHT RIBS AND CHEST - 3+ VIEW COMPARISON:  None. FINDINGS: Cardiac shadow is within normal limits. Lungs are clear bilaterally. No pneumothorax or effusion is seen. No acute rib abnormality noted. IMPRESSION: No evidence of acute rib fracture. Electronically Signed   By: Alcide Clever M.D.   On: 11/09/2020 22:30    ____________________________________________   INITIAL IMPRESSION / MDM / ASSESSMENT AND PLAN / ED COURSE  As part of my medical decision making, I reviewed the following data within the electronic MEDICAL RECORD NUMBER Nursing notes reviewed and incorporated, Old chart reviewed, Radiograph reviewed , and Notes from prior ED visits   Differential diagnosis includes, but is not limited to, rib contusion, rib fracture, pneumothorax, hematoma.  Patient's physical exam is reassuring with no external sign of injury but she has highly tender to palpation throughout most of her right sided anterior and lateral ribs.  No palpable deformities.  I personally reviewed the patient's imaging and agree with the radiologist's interpretation that there is no evidence of acute fracture on x-ray.  I talked with the patient about rib contusion versus rib fracture.  I offered a CT scan of the chest to see if there are any occult fractures not visible on x-ray, and she declined.  I think that is appropriate since the management would likely be the same.  I suggested a Lidoderm patch and I talked with her about incentive spirometry.  We also  discussed additional pain management and she says she cannot swallow any pills and she does not like needles so she declined a Toradol injection.  I ordered acetaminophen elixir 650 mg and encouraged her to get some liquid acetaminophen and liquid ibuprofen and use it as needed as well as a Lidoderm patch.  I gave my usual and customary return precautions and she understands and agrees with the plan.           ____________________________________________  FINAL CLINICAL IMPRESSION(S) / ED DIAGNOSES  Final diagnoses:  Rib contusion, right, initial encounter  Fall, initial encounter     MEDICATIONS GIVEN DURING THIS VISIT:  Medications  lidocaine (LIDODERM) 5 % 1 patch (has no administration in time range)  acetaminophen (TYLENOL) 160 MG/5ML solution 650 mg (has no administration in time range)     ED Discharge Orders          Ordered    lidocaine (LIDODERM) 5 %  Every 12  hours        11/10/20 0144             Note:  This document was prepared using Dragon voice recognition software and may include unintentional dictation errors.   Loleta Rose, MD 11/10/20 [phone]

## 2022-01-21 ENCOUNTER — Encounter: Payer: Self-pay | Admitting: *Deleted

## 2022-01-21 ENCOUNTER — Other Ambulatory Visit: Payer: Self-pay

## 2022-01-21 ENCOUNTER — Emergency Department
Admission: EM | Admit: 2022-01-21 | Discharge: 2022-01-21 | Discharge status: Against Medical Advice | Payer: Medicare Other | Attending: Emergency Medicine | Admitting: Emergency Medicine

## 2022-01-21 DIAGNOSIS — Z5321 Procedure and treatment not carried out due to patient leaving prior to being seen by health care provider: Secondary | ICD-10-CM | POA: Diagnosis not present

## 2022-01-21 DIAGNOSIS — Y990 Civilian activity done for income or pay: Secondary | ICD-10-CM | POA: Insufficient documentation

## 2022-01-21 DIAGNOSIS — W07XXXA Fall from chair, initial encounter: Secondary | ICD-10-CM | POA: Diagnosis not present

## 2022-01-21 DIAGNOSIS — M25511 Pain in right shoulder: Secondary | ICD-10-CM | POA: Diagnosis not present

## 2022-01-21 NOTE — ED Triage Notes (Addendum)
Here from work at [employer] s/p fall at [employer]. States, chair slid out from under me, fell backwards hitting back and R shoulder. Pinpoints pain to R trapezius and R scapula area. Fell into chairs. Pinpoints pain to R trapezius and R scapula area. Denies hitting head, LOC or blood thinners. Denies dizziness, NV, or sx other than pain. Mentions R leg is now getting stiff. No wounds or bleeding.

## 2022-11-21 ENCOUNTER — Other Ambulatory Visit: Payer: Self-pay

## 2022-11-21 ENCOUNTER — Inpatient Hospital Stay
Admission: EM | Admit: 2022-11-21 | Discharge: 2022-11-29 | DRG: 872 | Disposition: A | Discharge status: Home / Self Care | Payer: Medicare Other | Attending: Internal Medicine | Admitting: Internal Medicine

## 2022-11-21 ENCOUNTER — Emergency Department: Payer: Medicare Other

## 2022-11-21 DIAGNOSIS — R0602 Shortness of breath: Secondary | ICD-10-CM | POA: Diagnosis present

## 2022-11-21 DIAGNOSIS — E876 Hypokalemia: Secondary | ICD-10-CM | POA: Diagnosis present

## 2022-11-21 DIAGNOSIS — E669 Obesity, unspecified: Secondary | ICD-10-CM | POA: Insufficient documentation

## 2022-11-21 DIAGNOSIS — N179 Acute kidney failure, unspecified: Secondary | ICD-10-CM | POA: Diagnosis not present

## 2022-11-21 DIAGNOSIS — L03116 Cellulitis of left lower limb: Secondary | ICD-10-CM | POA: Diagnosis present

## 2022-11-21 DIAGNOSIS — R0689 Other abnormalities of breathing: Secondary | ICD-10-CM | POA: Diagnosis not present

## 2022-11-21 DIAGNOSIS — I499 Cardiac arrhythmia, unspecified: Secondary | ICD-10-CM | POA: Diagnosis not present

## 2022-11-21 DIAGNOSIS — R6889 Other general symptoms and signs: Secondary | ICD-10-CM | POA: Diagnosis not present

## 2022-11-21 DIAGNOSIS — Z1152 Encounter for screening for COVID-19: Secondary | ICD-10-CM

## 2022-11-21 DIAGNOSIS — A419 Sepsis, unspecified organism: Secondary | ICD-10-CM | POA: Diagnosis present

## 2022-11-21 DIAGNOSIS — Z6837 Body mass index (BMI) 37.0-37.9, adult: Secondary | ICD-10-CM | POA: Diagnosis not present

## 2022-11-21 DIAGNOSIS — R600 Localized edema: Secondary | ICD-10-CM | POA: Diagnosis not present

## 2022-11-21 DIAGNOSIS — R509 Fever, unspecified: Secondary | ICD-10-CM | POA: Diagnosis not present

## 2022-11-21 DIAGNOSIS — L0390 Cellulitis, unspecified: Secondary | ICD-10-CM | POA: Diagnosis not present

## 2022-11-21 LAB — CBC WITH DIFFERENTIAL/PLATELET
Abs Immature Granulocytes: 0.21 10*3/uL — ABNORMAL HIGH (ref 0.00–0.07)
Basophils Absolute: 0.1 10*3/uL (ref 0.0–0.1)
Basophils Relative: 1 %
Eosinophils Absolute: 0.1 10*3/uL (ref 0.0–0.5)
Eosinophils Relative: 1 %
HCT: 39.2 % (ref 36.0–46.0)
Hemoglobin: 12.7 g/dL (ref 12.0–15.0)
Immature Granulocytes: 1 %
Lymphocytes Relative: 3 %
Lymphs Abs: 0.5 10*3/uL — ABNORMAL LOW (ref 0.7–4.0)
MCH: 30.8 pg (ref 26.0–34.0)
MCHC: 32.4 g/dL (ref 30.0–36.0)
MCV: 94.9 fL (ref 80.0–100.0)
Monocytes Absolute: 1.2 10*3/uL — ABNORMAL HIGH (ref 0.1–1.0)
Monocytes Relative: 7 %
Neutro Abs: 14.3 10*3/uL — ABNORMAL HIGH (ref 1.7–7.7)
Neutrophils Relative %: 87 %
Platelets: 198 10*3/uL (ref 150–400)
RBC: 4.13 MIL/uL (ref 3.87–5.11)
RDW: 12.8 % (ref 11.5–15.5)
WBC: 16.4 10*3/uL — ABNORMAL HIGH (ref 4.0–10.5)
nRBC: 0 % (ref 0.0–0.2)

## 2022-11-21 LAB — URINALYSIS, W/ REFLEX TO CULTURE (INFECTION SUSPECTED)
Bacteria, UA: NONE SEEN
Bilirubin Urine: NEGATIVE
Glucose, UA: NEGATIVE mg/dL
Hgb urine dipstick: NEGATIVE
Ketones, ur: NEGATIVE mg/dL
Nitrite: NEGATIVE
Protein, ur: NEGATIVE mg/dL
Specific Gravity, Urine: 1.015 (ref 1.005–1.030)
pH: 8 (ref 5.0–8.0)

## 2022-11-21 LAB — COMPREHENSIVE METABOLIC PANEL
ALT: 15 U/L (ref 0–44)
AST: 27 U/L (ref 15–41)
Albumin: 3.9 g/dL (ref 3.5–5.0)
Alkaline Phosphatase: 61 U/L (ref 38–126)
Anion gap: 12 (ref 5–15)
BUN: 12 mg/dL (ref 8–23)
CO2: 24 mmol/L (ref 22–32)
Calcium: 9 mg/dL (ref 8.9–10.3)
Chloride: 99 mmol/L (ref 98–111)
Creatinine, Ser: 0.94 mg/dL (ref 0.44–1.00)
GFR, Estimated: >60 mL/min (ref >60)
Glucose, Bld: 111 mg/dL — ABNORMAL HIGH (ref 70–99)
Potassium: 3.4 mmol/L — ABNORMAL LOW (ref 3.5–5.1)
Sodium: 135 mmol/L (ref 135–145)
Total Bilirubin: 0.8 mg/dL (ref 0.3–1.2)
Total Protein: 7.4 g/dL (ref 6.5–8.1)

## 2022-11-21 LAB — PROTIME-INR
INR: 1.1 (ref 0.8–1.2)
Prothrombin Time: 14.2 s (ref 11.4–15.2)

## 2022-11-21 LAB — LACTIC ACID, PLASMA: Lactic Acid, Venous: 1.6 mmol/L (ref 0.5–1.9)

## 2022-11-21 LAB — SARS CORONAVIRUS 2 BY RT PCR: SARS Coronavirus 2 by RT PCR: NEGATIVE

## 2022-11-21 MED ORDER — ONDANSETRON HCL 4 MG PO TABS: 4.0000 mg | ORAL_TABLET | Freq: Four times a day (QID) | ORAL | Status: DC | PRN

## 2022-11-21 MED ORDER — ACETAMINOPHEN 650 MG RE SUPP: 650.0000 mg | Freq: Four times a day (QID) | RECTAL | Status: DC | PRN

## 2022-11-21 MED ORDER — ACETAMINOPHEN 325 MG PO TABS
650.0000 mg | ORAL_TABLET | Freq: Four times a day (QID) | ORAL | Status: DC | PRN
  Administered 2022-11-22 – 2022-11-25 (×4): 650 mg via ORAL
  Filled 2022-11-21 (×6): qty 2

## 2022-11-21 MED ORDER — SODIUM CHLORIDE 0.9 % IV SOLN
2.0000 g | INTRAVENOUS | Status: DC
  Filled 2022-11-21: qty 20

## 2022-11-21 MED ORDER — SODIUM CHLORIDE 0.9 % IV SOLN
2.0000 g | Freq: Once | INTRAVENOUS | Status: AC
  Administered 2022-11-21: 2 g via INTRAVENOUS
  Filled 2022-11-21: qty 12.5

## 2022-11-21 MED ORDER — VANCOMYCIN HCL 2000 MG/400ML IV SOLN
2000.0000 mg | Freq: Once | INTRAVENOUS | Status: AC
  Administered 2022-11-21: 2000 mg via INTRAVENOUS
  Filled 2022-11-21: qty 400

## 2022-11-21 MED ORDER — KETOROLAC TROMETHAMINE 30 MG/ML IJ SOLN
30.0000 mg | Freq: Four times a day (QID) | INTRAMUSCULAR | Status: AC | PRN
  Administered 2022-11-22 (×2): 30 mg via INTRAVENOUS
  Filled 2022-11-21 (×2): qty 1

## 2022-11-21 MED ORDER — LACTATED RINGERS IV BOLUS
1000.0000 mL | Freq: Once | INTRAVENOUS | Status: AC
  Administered 2022-11-21: 1000 mL via INTRAVENOUS

## 2022-11-21 MED ORDER — VANCOMYCIN HCL IN DEXTROSE 1-5 GM/200ML-% IV SOLN: 1000.0000 mg | Freq: Once | INTRAVENOUS | Status: DC

## 2022-11-21 MED ORDER — ENOXAPARIN SODIUM 60 MG/0.6ML IJ SOSY
50.0000 mg | PREFILLED_SYRINGE | INTRAMUSCULAR | Status: DC
  Administered 2022-11-22 – 2022-11-28 (×6): 50 mg via SUBCUTANEOUS
  Filled 2022-11-21 (×8): qty 0.6

## 2022-11-21 MED ORDER — LACTATED RINGERS IV SOLN
150.0000 mL/h | INTRAVENOUS | Status: AC
  Administered 2022-11-21 – 2022-11-22 (×2): 150 mL/h via INTRAVENOUS

## 2022-11-21 MED ORDER — ONDANSETRON HCL 4 MG/2ML IJ SOLN: 4.0000 mg | Freq: Four times a day (QID) | INTRAMUSCULAR | Status: DC | PRN

## 2022-11-21 NOTE — ED Triage Notes (Signed)
Per EMS  SOB Started at 9am  Worsening Body aches Chills Lightheaded Fever 103.2 temp Repeat 102.4 temp  Meds given  Tylenol 1g  400mg  IBU 400cc LR 22 LH  HR 104 RR 24-30 BP 124/60

## 2022-11-21 NOTE — Progress Notes (Signed)
PHARMACIST - PHYSICIAN COMMUNICATION  CONCERNING:  Enoxaparin (Lovenox) for DVT Prophylaxis    RECOMMENDATION: Patient was prescribed enoxaprin 40mg  q24 hours for VTE prophylaxis.   Filed Weights   11/21/22 1747  Weight: 99.8 kg (220 lb)    Body mass index is 35.51 kg/m.  Estimated Creatinine Clearance: 67.3 mL/min (by C-G formula based on SCr of 0.94 mg/dL).   Based on [facility] policy patient is candidate for enoxaparin 0.5mg /kg TBW SQ every 24 hours based on BMI being >30.  DESCRIPTION: Pharmacy has adjusted enoxaparin dose per [facility] policy.  Patient is now receiving enoxaparin 0.5 mg/kg every 24 hours   Otelia Sergeant, PharmD, [facility] 11/21/2022 10:28 PM

## 2022-11-21 NOTE — H&P (Signed)
History and Physical    Patient: Marissa Hayes JYN:[id number] DOB: [birth year] DOA: 11/21/2022 DOS: the patient was seen and examined on 11/21/2022 PCP: Pcp, No  Patient coming from: Home  Chief Complaint:  Chief Complaint  Patient presents with   Shortness of Breath   Generalized Body Aches   Fever   Chills   HPI: Marissa Hayes is a 67 y.o. female with medical history significant of cellulitis of the left upper extremity in the past, morbid obesity who presented to the ER with fever, shortness of breath, generalized aches and chills. Patient was found to have a temperature 101 pulse was 102 and leukocytosis white count of 16.4.  She has sepsis syndrome.  Patient additionally has mildly elevated blood pressure.  Workup in the ER shows left lower extremity redness swelling and warmth.  Patient appears to have cellulitis and sepsis as a result of that.  She is being admitted to the hospital for further evaluation and treatment.  Review of Systems: As mentioned in the history of present illness. All other systems reviewed and are negative. History reviewed. No pertinent past medical history. Past Surgical History:  Procedure Laterality Date   none     Social History:  reports that she has never smoked. She has never used smokeless tobacco. She reports that she does not drink alcohol and does not use drugs.  No Known Allergies  History reviewed. No pertinent family history.  Prior to Admission medications   Not on File    Physical Exam: Vitals:   11/21/22 2015 11/21/22 2045 11/21/22 2115 11/21/22 2141  BP:      Pulse: 89 86 81   Resp: (!) 24 (!) 23 (!) 24   Temp:    99 F (37.2 C)  TempSrc:    Oral  SpO2: 96% 100% 99%   Weight:      Height:       Constitutional: NAD, calm, comfortable Eyes: PERRL, lids and conjunctivae normal ENMT: Mucous membranes are moist. Posterior pharynx clear of any exudate or lesions.Normal dentition.  Neck: normal, supple, no masses, no  thyromegaly Respiratory: clear to auscultation bilaterally, no wheezing, no crackles. Normal respiratory effort. No accessory muscle use.  Cardiovascular: Regular rate and rhythm, no murmurs / rubs / gallops. No extremity edema. 2+ pedal pulses. No carotid bruits.  Abdomen: no tenderness, no masses palpated. No hepatosplenomegaly. Bowel sounds positive.  Musculoskeletal: Good range of motion, no joint swelling or tenderness, Skin: Left lower extremity cellulitis, leg is warm, red, swollen and tender neurologic: CN 2-12 grossly intact. Sensation intact, DTR normal. Strength 5/5 in all 4.  Psychiatric: Normal judgment and insight. Alert and oriented x 3. Normal mood  Data Reviewed:  White count 16.4 potassium 3.4 glucose 111.  Temperature 101 pulse of 102 respiratory of 29 oxygen sat 96% on room air  Assessment and Plan:  #1 sepsis secondary to cellulitis: Patient will be admitted and sepsis protocol will be followed.  Check lactic acid.  Aggressively hydrate.  Initiate IV antibiotics.  Has received vancomycin.  Transition to Rocephin.  Follow blood cultures.  #2 cellulitis of left lower extremity: Patient has had that previously.  No evidence of insect bite or any injury.  Elevate the foot.  Continue IV antibiotics  #3 hypokalemia: Will replete potassium      Advance Care Planning:   Code Status: Full Code   Consults: None  Family Communication: Daughter at bedside  Severity of Illness: The appropriate patient status for this patient  is INPATIENT. Inpatient status is judged to be reasonable and necessary in order to provide the required intensity of service to ensure the patient's safety. The patient's presenting symptoms, physical exam findings, and initial radiographic and laboratory data in the context of their chronic comorbidities is felt to place them at high risk for further clinical deterioration. Furthermore, it is not anticipated that the patient will be medically stable for  discharge from the hospital within 2 midnights of admission.   * I certify that at the point of admission it is my clinical judgment that the patient will require inpatient hospital care spanning beyond 2 midnights from the point of admission due to high intensity of service, high risk for further deterioration and high frequency of surveillance required.*  AuthorLonia Blood, MD 11/21/2022 10:10 PM  For on call review [email or web address].

## 2022-11-21 NOTE — ED Provider Notes (Signed)
[facility] Provider Note    Event Date/Time   First MD Initiated Contact with Patient 11/21/22 1745     (approximate)   History   Shortness of Breath, Generalized Body Aches, Fever, and Chills   HPI  Marissa Hayes is a 67 y.o. female who presents to the emergency department today via EMS because of concerns for weakness and slight confusion.  Patient states that she had to leave work early today because she felt dizzy and lightheaded.  Yesterday was essentially in her normal state of health although did have a slight cough.  Did not feel like she had had any fevers today although was found to be febrile by EMS.  She says that there has been COVID at her work.     Physical Exam   Triage Vital Signs: ED Triage Vitals  Encounter Vitals Group     BP 11/21/22 1751 (!) 131/55     Systolic BP Percentile --      Diastolic BP Percentile --      Pulse Rate 11/21/22 1747 (!) 102     Resp 11/21/22 1751 (!) 23     Temp 11/21/22 1747 (!) 101 F (38.3 C)     Temp Source 11/21/22 1747 Oral     SpO2 11/21/22 1747 100 %     Weight 11/21/22 1747 220 lb (99.8 kg)     Height 11/21/22 1747 5\' 6"  (1.676 m)     Head Circumference --      Peak Flow --      Pain Score 11/21/22 1746 7     Pain Loc --      Pain Education --      Exclude from Growth Chart --     Most recent vital signs: Vitals:   11/21/22 1751 11/21/22 1752  BP: (!) 131/55   Pulse:    Resp: (!) 23   Temp:    SpO2:  100%   General: Awake, alert, oriented. CV:  Good peripheral perfusion. Regular rate and rhythm. Resp:  Normal effort. Lungs clear. Abd:  No distention. Non tender. Skin:  Erythema and warmth to left lower leg  ED Results / Procedures / Treatments   Labs (all labs ordered are listed, but only abnormal results are displayed) Labs Reviewed  COMPREHENSIVE METABOLIC PANEL - Abnormal; Notable for the following components:      Result Value   Potassium 3.4 (*)    Glucose, Bld 111 (*)     All other components within normal limits  CBC WITH DIFFERENTIAL/PLATELET - Abnormal; Notable for the following components:   WBC 16.4 (*)    Neutro Abs 14.3 (*)    Lymphs Abs 0.5 (*)    Monocytes Absolute 1.2 (*)    Abs Immature Granulocytes 0.21 (*)    All other components within normal limits  URINALYSIS, W/ REFLEX TO CULTURE (INFECTION SUSPECTED) - Abnormal; Notable for the following components:   Color, Urine YELLOW (*)    APPearance HAZY (*)    Leukocytes,Ua SMALL (*)    All other components within normal limits  SARS CORONAVIRUS 2 BY RT PCR  CULTURE, BLOOD (ROUTINE X 2)  CULTURE, BLOOD (ROUTINE X 2)  LACTIC ACID, PLASMA  PROTIME-INR  LACTIC ACID, PLASMA     EKG  I, Phineas Semen, attending physician, personally viewed and interpreted this EKG  EKG Time: 1749 Rate: 97 Rhythm: sinus rhythm Axis: normal Intervals: qtc 435 QRS: narrow ST changes: no st elevation Impression: abnormal  ekg   RADIOLOGY I independently interpreted and visualized the CXR. My interpretation: No pneumonia Radiology interpretation:  IMPRESSION:  No acute chest findings.  Rotated exam.     PROCEDURES:  Critical Care performed: Yes  CRITICAL CARE Performed by: Phineas Semen   Total critical care time: 30 minutes  Critical care time was exclusive of separately billable procedures and treating other patients.  Critical care was necessary to treat or prevent imminent or life-threatening deterioration.  Critical care was time spent personally by me on the following activities: development of treatment plan with patient and/or surrogate as well as nursing, discussions with consultants, evaluation of patient's response to treatment, examination of patient, obtaining history from patient or surrogate, ordering and performing treatments and interventions, ordering and review of laboratory studies, ordering and review of radiographic studies, pulse oximetry and re-evaluation of  patient's condition.   Procedures    MEDICATIONS ORDERED IN ED: Medications - No data to display   IMPRESSION / MDM / ASSESSMENT AND PLAN / ED COURSE  I reviewed the triage vital signs and the nursing notes.                              Differential diagnosis includes, but is not limited to, pneumonia, UTI, covid, cellulitis  Patient's presentation is most consistent with acute presentation with potential threat to life or bodily function.   The patient is on the cardiac monitor to evaluate for evidence of arrhythmia and/or significant heart rate changes.  Patient presented to the emergency department today because of concerns for weakness and some confusion.  Initial vital signs concerning for tachycardia, tachypnea and fever.  Given concern for infection broad workup was initiated.  Patient blood work with leukocytosis.  No lactic acidosis.  Chest x-ray without obvious pneumonia.  COVID-negative.  No evidence for urinary tract infection.  At this time I do think the erythema and warmth to the left lower leg could be due to cellulitis.  Discussed with Dr. Mikeal Hawthorne with the hospitalist service who will evaluate for admission.      FINAL CLINICAL IMPRESSION(S) / ED DIAGNOSES   Final diagnoses:  Cellulitis, unspecified cellulitis site     Note:  This document was prepared using Dragon voice recognition software and may include unintentional dictation errors.    Phineas Semen, MD 11/21/22 2150

## 2022-11-22 ENCOUNTER — Inpatient Hospital Stay: Payer: Medicare Other

## 2022-11-22 DIAGNOSIS — E876 Hypokalemia: Secondary | ICD-10-CM

## 2022-11-22 DIAGNOSIS — L03116 Cellulitis of left lower limb: Secondary | ICD-10-CM

## 2022-11-22 DIAGNOSIS — E669 Obesity, unspecified: Secondary | ICD-10-CM | POA: Insufficient documentation

## 2022-11-22 DIAGNOSIS — A419 Sepsis, unspecified organism: Secondary | ICD-10-CM | POA: Diagnosis not present

## 2022-11-22 LAB — COMPREHENSIVE METABOLIC PANEL
ALT: 17 U/L (ref 0–44)
AST: 32 U/L (ref 15–41)
Albumin: 3.1 g/dL — ABNORMAL LOW (ref 3.5–5.0)
Alkaline Phosphatase: 57 U/L (ref 38–126)
Anion gap: 6 (ref 5–15)
BUN: 18 mg/dL (ref 8–23)
CO2: 26 mmol/L (ref 22–32)
Calcium: 8.5 mg/dL — ABNORMAL LOW (ref 8.9–10.3)
Chloride: 105 mmol/L (ref 98–111)
Creatinine, Ser: 1.04 mg/dL — ABNORMAL HIGH (ref 0.44–1.00)
GFR, Estimated: 58 mL/min — ABNORMAL LOW (ref >60)
Glucose, Bld: 105 mg/dL — ABNORMAL HIGH (ref 70–99)
Potassium: 3.9 mmol/L (ref 3.5–5.1)
Sodium: 137 mmol/L (ref 135–145)
Total Bilirubin: 0.5 mg/dL (ref 0.3–1.2)
Total Protein: 6.4 g/dL — ABNORMAL LOW (ref 6.5–8.1)

## 2022-11-22 LAB — CBC
HCT: 34 % — ABNORMAL LOW (ref 36.0–46.0)
Hemoglobin: 11.5 g/dL — ABNORMAL LOW (ref 12.0–15.0)
MCH: 30.7 pg (ref 26.0–34.0)
MCHC: 33.8 g/dL (ref 30.0–36.0)
MCV: 90.9 fL (ref 80.0–100.0)
Platelets: 188 10*3/uL (ref 150–400)
RBC: 3.74 MIL/uL — ABNORMAL LOW (ref 3.87–5.11)
RDW: 13.2 % (ref 11.5–15.5)
WBC: 13.4 10*3/uL — ABNORMAL HIGH (ref 4.0–10.5)
nRBC: 0 % (ref 0.0–0.2)

## 2022-11-22 LAB — HIV ANTIBODY (ROUTINE TESTING W REFLEX): HIV Screen 4th Generation wRfx: NONREACTIVE

## 2022-11-22 LAB — PROTIME-INR
INR: 1.3 — ABNORMAL HIGH (ref 0.8–1.2)
Prothrombin Time: 16 s — ABNORMAL HIGH (ref 11.4–15.2)

## 2022-11-22 LAB — LACTIC ACID, PLASMA: Lactic Acid, Venous: 1.6 mmol/L (ref 0.5–1.9)

## 2022-11-22 LAB — PROCALCITONIN: Procalcitonin: 4.93 ng/mL

## 2022-11-22 LAB — CORTISOL-AM, BLOOD: Cortisol - AM: 24.2 ug/dL — ABNORMAL HIGH (ref 6.7–22.6)

## 2022-11-22 MED ORDER — SODIUM CHLORIDE 0.9 % IV SOLN
2.0000 g | Freq: Three times a day (TID) | INTRAVENOUS | Status: DC
  Administered 2022-11-23 – 2022-11-26 (×10): 2 g via INTRAVENOUS
  Filled 2022-11-22 (×11): qty 12.5

## 2022-11-22 MED ORDER — VANCOMYCIN HCL 1250 MG/250ML IV SOLN
1250.0000 mg | INTRAVENOUS | Status: DC
  Administered 2022-11-22 – 2022-11-24 (×3): 1250 mg via INTRAVENOUS
  Filled 2022-11-22 (×3): qty 250

## 2022-11-22 MED ORDER — SODIUM CHLORIDE 0.9 % IV SOLN: INTRAVENOUS | Status: DC | PRN

## 2022-11-22 MED ORDER — VANCOMYCIN HCL 1250 MG/250ML IV SOLN: 1250.0000 mg | INTRAVENOUS | Status: DC

## 2022-11-22 MED ORDER — VANCOMYCIN HCL IN DEXTROSE 1-5 GM/200ML-% IV SOLN: 1000.0000 mg | Freq: Once | INTRAVENOUS | Status: DC

## 2022-11-22 MED ORDER — SODIUM CHLORIDE 0.9 % IV SOLN
2.0000 g | Freq: Once | INTRAVENOUS | Status: AC
  Administered 2022-11-22: 2 g via INTRAVENOUS
  Filled 2022-11-22: qty 12.5

## 2022-11-22 NOTE — Progress Notes (Signed)
Elevated temp. MD aware. Tylenol administered.  Cornell Barman Kemonie Cutillo

## 2022-11-22 NOTE — Discharge Instructions (Signed)
Some PCP options in [place] area- not a comprehensive list  [facility]- [phone] [place]- [phone] [facility]- [phone] [facility]- [phone] [facility]- [phone] [place]- [phone]  or [facility] [place] [facility] [phone]  

## 2022-11-22 NOTE — TOC CM/SW Note (Signed)
Transition of Care [facility]) - Inpatient Brief Assessment   Patient Details  Name: Marissa Hayes MRN: [id number] Date of Birth: 04-Jan-1956  Transition of Care [facility]) CM/SW Contact:    Chapman Fitch, RN Phone Number: 11/22/2022, 4:11 PM   Clinical Narrative:   Transition of Care [facility]) Screening Note   Patient Details  Name: Marissa Hayes Date of Birth: [birth year]   Transition of Care [facility]) CM/SW Contact:    Chapman Fitch, RN Phone Number: 11/22/2022, 4:11 PM    Transition of Care Department [facility]) has reviewed patient and no TOC needs have been identified at this time. We will continue to monitor patient advancement through interdisciplinary progression rounds. If new patient transition needs arise, please place a TOC consult.   List of local PCP added to AVS  Transition of Care Asessment: Insurance and Status: Insurance coverage has been reviewed Patient has primary care physician: No (List of local PCP added to AVS)     Prior/Current Home Services: No current home services Social Determinants of Health Reivew: SDOH reviewed no interventions necessary Readmission risk has been reviewed: Yes Transition of care needs: no transition of care needs at this time

## 2022-11-22 NOTE — Progress Notes (Signed)
Pharmacy Antibiotic Note  Marissa Hayes is a 67 y.o. female admitted on 11/21/2022 with cellulitis.  Pharmacy has been consulted for Cefepime and Vancomycin dosing.  Plan: Patient received Vancomycin 2000 mg IV x 1 on 9/5 will continue with Vancomycin 1250 mg IV Q 24 hrs. Goal AUC 400-550. Expected AUC: 507.6 SCr used: 1.04 Expected Cmin: 12.5  Cefepime 2g IV q8h   Height: 5\' 7"  (170.2 cm) Weight: 108 kg (238 lb 1.6 oz) IBW/kg (Calculated) : 61.6  Temp (24hrs), Avg:99.9 F (37.7 C), Min:98.3 F (36.8 C), Max:103 F (39.4 C)  Recent Labs  Lab 11/21/22 1809 11/21/22 2359 11/22/22 0618  WBC 16.4*  --  13.4*  CREATININE 0.94  --  1.04*  LATICACIDVEN 1.6 1.6  --     Estimated Creatinine Clearance: 64.6 mL/min (A) (by C-G formula based on SCr of 1.04 mg/dL (H)).    No Known Allergies  Antimicrobials this admission: Vancomcyin 9/5 >>  Cefepime 9/6 >>   Dose adjustments this admission:  Microbiology results:   Thank you for allowing pharmacy to be a part of this patient's care.  Clovia Cuff, PharmD, BCPS 11/22/2022 9:15 PM

## 2022-11-22 NOTE — Progress Notes (Addendum)
Progress Note   Patient: Marissa Hayes ZOX:[id number] DOB: [birth year] DOA: 11/21/2022     1 DOS: the patient was seen and examined on 11/22/2022   Brief hospital course: BHAVYA FABIEN is a 67 y.o. female with medical history significant of cellulitis of the left upper extremity in the past, morbid obesity who presented to the ER with fever, shortness of breath, generalized aches and chills. Patient was found to have a temperature 101 pulse was 102 and leukocytosis white count of 16.4.  Admitted for sepsis secondary to left leg cellulitis.  Assessment and Plan: Sepsis secondary to cellulitis:  Continue sepsis protocol. Lactic acid normal, WBC 16. Remains febrile. Continue IV fluids.  Given her persistent fever, I will transition her back to broad spectrum antibiotics Vanco+ cefepime, pharmacy to dose.   Cellulitis of left lower extremity:  Had prior similar presentation. Uls left lower extremity negative for DVT. She denies any trauma or insect bite. Elevate the limb.  Continue broad spectrum antibiotics.  Hypokalemia:  Potassium improved. Will replete potassium as needed and recheck.  Obesity Body mass index is 37.29 kg/m. Diet, exercise and weight reduction advised.     Subjective: Patient is seen and examined today morning. She is lying in bed, has sharp pain in both lower extremities occasionally. Has been febrile, feels weak. I advised out of bed to chair.  Physical Exam: Vitals:   11/21/22 2141 11/21/22 2339 11/22/22 0139 11/22/22 0903  BP:  132/62 123/71 128/64  Pulse:  87 (!) 105 98  Resp:  20 20 18   Temp: 99 F (37.2 C) 98.3 F (36.8 C) 99.6 F (37.6 C) 98.4 F (36.9 C)  TempSrc: Oral Oral Oral   SpO2:  100% 100% 100%  Weight:  108 kg    Height:  5\' 7"  (1.702 m)     General -Elderly African American female, distress due to pain. HEENT - PERRLA, EOMI, atraumatic head, non tender sinuses. Lung -clear, no rales, rhonchi. Heart - S1, S2 heard, no murmurs, rubs, trace  pedal edema Neuro - Alert, awake and oriented x 3, non focal exam. Skin - Warm and dry. Left leg swelling, redness.  Data Reviewed:     Latest Ref Rng & Units 11/22/2022    6:18 AM 11/21/2022    6:09 PM 11/03/2018    4:29 AM  CBC  WBC 4.0 - 10.5 K/uL 13.4  16.4  6.5   Hemoglobin 12.0 - 15.0 g/dL 81.1  91.4  78.2   Hematocrit 36.0 - 46.0 % 34.0  39.2  33.6   Platelets 150 - 400 K/uL 188  198  380        Latest Ref Rng & Units 11/22/2022    6:18 AM 11/21/2022    6:09 PM 11/04/2018    4:22 AM  BMP  Glucose 70 - 99 mg/dL 956  213    BUN 8 - 23 mg/dL 18  12    Creatinine 0.86 - 1.00 mg/dL 5.78  4.69  6.29   Sodium 135 - 145 mmol/L 137  135    Potassium 3.5 - 5.1 mmol/L 3.9  3.4    Chloride 98 - 111 mmol/L 105  99    CO2 22 - 32 mmol/L 26  24    Calcium 8.9 - 10.3 mg/dL 8.5  9.0     US Venous Img Lower Unilateral Left (DVT)  Result Date: 11/22/2022 CLINICAL DATA:  Left lower extremity edema. EXAM: LEFT LOWER EXTREMITY VENOUS DOPPLER ULTRASOUND TECHNIQUE: Gray-scale sonography  with graded compression, as well as color Doppler and duplex ultrasound were performed to evaluate the lower extremity deep venous systems from the level of the common femoral vein and including the common femoral, femoral, profunda femoral, popliteal and calf veins including the posterior tibial, peroneal and gastrocnemius veins when visible. The superficial great saphenous vein was also interrogated. Spectral Doppler was utilized to evaluate flow at rest and with distal augmentation maneuvers in the common femoral, femoral and popliteal veins. COMPARISON:  None Available. FINDINGS: Contralateral Common Femoral Vein: Respiratory phasicity is normal and symmetric with the symptomatic side. No evidence of thrombus. Normal compressibility. Common Femoral Vein: No evidence of thrombus. Normal compressibility, respiratory phasicity and response to augmentation. Saphenofemoral Junction: No evidence of thrombus. Normal compressibility  and flow on color Doppler imaging. Profunda Femoral Vein: No evidence of thrombus. Normal compressibility and flow on color Doppler imaging. Femoral Vein: No evidence of thrombus. Normal compressibility, respiratory phasicity and response to augmentation. Popliteal Vein: No evidence of thrombus. Normal compressibility, respiratory phasicity and response to augmentation. Calf Veins: No evidence of thrombus. Normal compressibility and flow on color Doppler imaging. Superficial Great Saphenous Vein: No evidence of thrombus. Normal compressibility. Venous Reflux:  None. Other Findings: No evidence of superficial thrombophlebitis or abnormal fluid collection. IMPRESSION: No evidence of left lower extremity deep venous thrombosis. Electronically Signed   By: Irish Lack M.D.   On: 11/22/2022 16:12   DG Chest Port 1 View  Result Date: 11/21/2022 CLINICAL DATA:  Sepsis.  Body aches.  Fever. EXAM: PORTABLE CHEST 1 VIEW COMPARISON:  11/09/2020 FINDINGS: Rotated exam. The heart is upper normal in size. Normal mediastinal contours allowing for rotation. No focal airspace disease. No pulmonary edema. No large pleural effusion or pneumothorax. No acute osseous findings. IMPRESSION: No acute chest findings.  Rotated exam. Electronically Signed   By: Narda Rutherford M.D.   On: 11/21/2022 20:01     Family Communication: Patient understands and agrees with current care plan.  Disposition: Status is: Inpatient Remains inpatient appropriate because: sepsis work up  Planned Discharge Destination: Home    Time spent: 45 minutes  Author: Marcelino Duster, MD 11/22/2022 4:13 PM  For on call review [email or web address].

## 2022-11-22 NOTE — Plan of Care (Signed)
[name] [name]

## 2022-11-23 DIAGNOSIS — L03116 Cellulitis of left lower limb: Secondary | ICD-10-CM | POA: Diagnosis not present

## 2022-11-23 DIAGNOSIS — L0390 Cellulitis, unspecified: Secondary | ICD-10-CM | POA: Diagnosis not present

## 2022-11-23 DIAGNOSIS — E669 Obesity, unspecified: Secondary | ICD-10-CM | POA: Diagnosis not present

## 2022-11-23 DIAGNOSIS — E876 Hypokalemia: Secondary | ICD-10-CM | POA: Diagnosis not present

## 2022-11-23 LAB — CBC
HCT: 32.9 % — ABNORMAL LOW (ref 36.0–46.0)
Hemoglobin: 11.1 g/dL — ABNORMAL LOW (ref 12.0–15.0)
MCH: 30.9 pg (ref 26.0–34.0)
MCHC: 33.7 g/dL (ref 30.0–36.0)
MCV: 91.6 fL (ref 80.0–100.0)
Platelets: 164 10*3/uL (ref 150–400)
RBC: 3.59 MIL/uL — ABNORMAL LOW (ref 3.87–5.11)
RDW: 13.2 % (ref 11.5–15.5)
WBC: 12.7 10*3/uL — ABNORMAL HIGH (ref 4.0–10.5)
nRBC: 0 % (ref 0.0–0.2)

## 2022-11-23 LAB — BASIC METABOLIC PANEL
Anion gap: 8 (ref 5–15)
BUN: 16 mg/dL (ref 8–23)
CO2: 23 mmol/L (ref 22–32)
Calcium: 8.1 mg/dL — ABNORMAL LOW (ref 8.9–10.3)
Chloride: 105 mmol/L (ref 98–111)
Creatinine, Ser: 0.97 mg/dL (ref 0.44–1.00)
GFR, Estimated: >60 mL/min (ref >60)
Glucose, Bld: 109 mg/dL — ABNORMAL HIGH (ref 70–99)
Potassium: 3.5 mmol/L (ref 3.5–5.1)
Sodium: 136 mmol/L (ref 135–145)

## 2022-11-23 NOTE — Progress Notes (Signed)
Progress Note   Patient: Marissa Hayes:[id number] DOB: [birth year] DOA: 11/21/2022     2 DOS: the patient was seen and examined on 11/23/2022   Brief hospital course: Marissa Hayes is a 67 y.o. female with medical history significant of cellulitis of the left upper extremity in the past, morbid obesity who presented to the ER with fever, shortness of breath, generalized aches and chills. Patient was found to have a temperature 101 pulse was 102 and leukocytosis white count of 16.4.  Admitted for sepsis secondary to left leg cellulitis staretd on broad spectrum antibiotics.  Assessment and Plan: Sepsis secondary to cellulitis:  Continue sepsis protocol. Lactic acid normal, WBC trending down. Today fever improved. Stop IV fluids.  Continue broad spectrum antibiotics Vanco+ cefepime, pharmacy to dose.   Cellulitis of left lower extremity:  Had prior similar presentation. Uls left lower extremity negative for DVT. Elevate the limb.  Continue broad spectrum antibiotics. Follow blood cultures.  Hypokalemia:  Potassium improved. Will replete potassium as needed and recheck.  Obesity Body mass index is 37.29 kg/m. Diet, exercise and weight reduction advised.     Subjective: Patient is seen and examined today morning. She is lying in bed, eating fair. Able to get out of bed. Afebrile, feels weak.   Physical Exam: Vitals:   11/22/22 2201 11/23/22 0245 11/23/22 0809 11/23/22 1606  BP: (!) 151/71 136/67 (!) 141/64 139/68  Pulse: 91 79 89 78  Resp: 17 19 18 16   Temp: 100.3 F (37.9 C) 99.8 F (37.7 C) 98.1 F (36.7 C) 98.1 F (36.7 C)  TempSrc: Oral Oral Oral Oral  SpO2: 100% 99% 100% 100%  Weight:      Height:       General -Elderly African American female, distress due to pain. HEENT - PERRLA, EOMI, atraumatic head, non tender sinuses. Lung -clear, no rales, rhonchi. Heart - S1, S2 heard, no murmurs, rubs, trace pedal edema Neuro - Alert, awake and oriented x 3, non focal  exam. Skin - Warm and dry. Left leg swelling, redness.  Data Reviewed:     Latest Ref Rng & Units 11/23/2022    5:21 AM 11/22/2022    6:18 AM 11/21/2022    6:09 PM  CBC  WBC 4.0 - 10.5 K/uL 12.7  13.4  16.4   Hemoglobin 12.0 - 15.0 g/dL 95.6  21.3  08.6   Hematocrit 36.0 - 46.0 % 32.9  34.0  39.2   Platelets 150 - 400 K/uL 164  188  198        Latest Ref Rng & Units 11/23/2022    5:21 AM 11/22/2022    6:18 AM 11/21/2022    6:09 PM  BMP  Glucose 70 - 99 mg/dL 578  469  629   BUN 8 - 23 mg/dL 16  18  12    Creatinine 0.44 - 1.00 mg/dL 5.28  4.13  2.44   Sodium 135 - 145 mmol/L 136  137  135   Potassium 3.5 - 5.1 mmol/L 3.5  3.9  3.4   Chloride 98 - 111 mmol/L 105  105  99   CO2 22 - 32 mmol/L 23  26  24    Calcium 8.9 - 10.3 mg/dL 8.1  8.5  9.0    US Venous Img Lower Unilateral Left (DVT)  Result Date: 11/22/2022 CLINICAL DATA:  Left lower extremity edema. EXAM: LEFT LOWER EXTREMITY VENOUS DOPPLER ULTRASOUND TECHNIQUE: Gray-scale sonography with graded compression, as well as color Doppler and duplex  ultrasound were performed to evaluate the lower extremity deep venous systems from the level of the common femoral vein and including the common femoral, femoral, profunda femoral, popliteal and calf veins including the posterior tibial, peroneal and gastrocnemius veins when visible. The superficial great saphenous vein was also interrogated. Spectral Doppler was utilized to evaluate flow at rest and with distal augmentation maneuvers in the common femoral, femoral and popliteal veins. COMPARISON:  None Available. FINDINGS: Contralateral Common Femoral Vein: Respiratory phasicity is normal and symmetric with the symptomatic side. No evidence of thrombus. Normal compressibility. Common Femoral Vein: No evidence of thrombus. Normal compressibility, respiratory phasicity and response to augmentation. Saphenofemoral Junction: No evidence of thrombus. Normal compressibility and flow on color Doppler imaging.  Profunda Femoral Vein: No evidence of thrombus. Normal compressibility and flow on color Doppler imaging. Femoral Vein: No evidence of thrombus. Normal compressibility, respiratory phasicity and response to augmentation. Popliteal Vein: No evidence of thrombus. Normal compressibility, respiratory phasicity and response to augmentation. Calf Veins: No evidence of thrombus. Normal compressibility and flow on color Doppler imaging. Superficial Great Saphenous Vein: No evidence of thrombus. Normal compressibility. Venous Reflux:  None. Other Findings: No evidence of superficial thrombophlebitis or abnormal fluid collection. IMPRESSION: No evidence of left lower extremity deep venous thrombosis. Electronically Signed   By: Irish Lack M.D.   On: 11/22/2022 16:12   DG Chest Port 1 View  Result Date: 11/21/2022 CLINICAL DATA:  Sepsis.  Body aches.  Fever. EXAM: PORTABLE CHEST 1 VIEW COMPARISON:  11/09/2020 FINDINGS: Rotated exam. The heart is upper normal in size. Normal mediastinal contours allowing for rotation. No focal airspace disease. No pulmonary edema. No large pleural effusion or pneumothorax. No acute osseous findings. IMPRESSION: No acute chest findings.  Rotated exam. Electronically Signed   By: Narda Rutherford M.D.   On: 11/21/2022 20:01     Family Communication: Patient understands and agrees with current care plan.  Disposition: Status is: Inpatient Remains inpatient appropriate because: sepsis work up, IV antibiotics.  Planned Discharge Destination: Home    Time spent: 42 minutes  Author: Marcelino Duster, MD 11/23/2022 5:10 PM  For on call review [email or web address].

## 2022-11-23 NOTE — Progress Notes (Signed)
Dear Doctor: Marissa Hayes. This patient has been identified as a candidate for PICC for the following reason (s): poor veins/poor circulatory system (CHF, COPD, emphysema, diabetes, steroid use, IV drug abuse, etc.) If you agree, please write an order for the indicated device. For any questions contact the Vascular Access Team at [phone] if no answer, please leave a message.  Thank you for supporting the early vascular access assessment program.

## 2022-11-24 DIAGNOSIS — E876 Hypokalemia: Secondary | ICD-10-CM | POA: Diagnosis not present

## 2022-11-24 DIAGNOSIS — E669 Obesity, unspecified: Secondary | ICD-10-CM | POA: Diagnosis not present

## 2022-11-24 DIAGNOSIS — L0390 Cellulitis, unspecified: Secondary | ICD-10-CM | POA: Diagnosis not present

## 2022-11-24 DIAGNOSIS — L03116 Cellulitis of left lower limb: Secondary | ICD-10-CM | POA: Diagnosis not present

## 2022-11-24 NOTE — Progress Notes (Signed)
Progress Note   Patient: Marissa Hayes UXL:[id number] DOB: 06/08/1955 DOA: 11/21/2022     3 DOS: the patient was seen and examined on 11/24/2022   Brief hospital course: Marissa Hayes is a 67 y.o. female with medical history significant of cellulitis of the left upper extremity in the past, morbid obesity who presented to the ER with fever, shortness of breath, generalized aches and chills. Patient was found to have a temperature 101 pulse was 102 and leukocytosis white count of 16.4.  Admitted for sepsis secondary to left leg cellulitis staretd on broad spectrum antibiotics.  Assessment and Plan: Sepsis secondary to cellulitis:  Continue sepsis protocol. Lactic acid normal, WBC trending down. Low grade fever last night. Continue broad spectrum antibiotics Vanco+ cefepime, pharmacy to dose.   Cellulitis of left lower extremity:  Had prior similar presentation. Uls left lower extremity negative for DVT. Elevate the limb.  Continue broad spectrum antibiotics. Follow blood cultures.  Hypokalemia:  Potassium improved. Will replete potassium as needed and recheck.  Obesity Body mass index is 37.29 kg/m. Diet, exercise and weight reduction advised.     Subjective: Patient is seen and examined today morning. She is lying in bed, eating fair. Wishes to go home. Tmax 100.4, able to get out of bed.  Physical Exam: Vitals:   11/23/22 1606 11/23/22 2040 11/24/22 0329 11/24/22 1052  BP: 139/68 (!) 149/68 121/61 139/75  Pulse: 78 87 82 81  Resp: 16 20 20 18   Temp: 98.1 F (36.7 C) (!) 100.4 F (38 C) 98.4 F (36.9 C) 99.7 F (37.6 C)  TempSrc: Oral Oral Oral Oral  SpO2: 100% 98% 100% 100%  Weight:      Height:       General -Elderly African American female, distress due to pain. HEENT - PERRLA, EOMI, atraumatic head, non tender sinuses. Lung - clear, no rales, rhonchi. Heart - S1, S2 heard, no murmurs, rubs, trace pedal edema Neuro - Alert, awake and oriented x 3, non focal  exam. Skin - Warm and dry. Left leg swelling, redness from below knee slow improvement noted.  Data Reviewed:     Latest Ref Rng & Units 11/23/2022    5:21 AM 11/22/2022    6:18 AM 11/21/2022    6:09 PM  CBC  WBC 4.0 - 10.5 K/uL 12.7  13.4  16.4   Hemoglobin 12.0 - 15.0 g/dL 53.6  64.4  03.4   Hematocrit 36.0 - 46.0 % 32.9  34.0  39.2   Platelets 150 - 400 K/uL 164  188  198        Latest Ref Rng & Units 11/23/2022    5:21 AM 11/22/2022    6:18 AM 11/21/2022    6:09 PM  BMP  Glucose 70 - 99 mg/dL 742  595  638   BUN 8 - 23 mg/dL 16  18  12    Creatinine 0.44 - 1.00 mg/dL 7.56  4.33  2.95   Sodium 135 - 145 mmol/L 136  137  135   Potassium 3.5 - 5.1 mmol/L 3.5  3.9  3.4   Chloride 98 - 111 mmol/L 105  105  99   CO2 22 - 32 mmol/L 23  26  24    Calcium 8.9 - 10.3 mg/dL 8.1  8.5  9.0    US Venous Img Lower Unilateral Left (DVT)  Result Date: 11/22/2022 CLINICAL DATA:  Left lower extremity edema. EXAM: LEFT LOWER EXTREMITY VENOUS DOPPLER ULTRASOUND TECHNIQUE: Gray-scale sonography with graded compression,  as well as color Doppler and duplex ultrasound were performed to evaluate the lower extremity deep venous systems from the level of the common femoral vein and including the common femoral, femoral, profunda femoral, popliteal and calf veins including the posterior tibial, peroneal and gastrocnemius veins when visible. The superficial great saphenous vein was also interrogated. Spectral Doppler was utilized to evaluate flow at rest and with distal augmentation maneuvers in the common femoral, femoral and popliteal veins. COMPARISON:  None Available. FINDINGS: Contralateral Common Femoral Vein: Respiratory phasicity is normal and symmetric with the symptomatic side. No evidence of thrombus. Normal compressibility. Common Femoral Vein: No evidence of thrombus. Normal compressibility, respiratory phasicity and response to augmentation. Saphenofemoral Junction: No evidence of thrombus. Normal  compressibility and flow on color Doppler imaging. Profunda Femoral Vein: No evidence of thrombus. Normal compressibility and flow on color Doppler imaging. Femoral Vein: No evidence of thrombus. Normal compressibility, respiratory phasicity and response to augmentation. Popliteal Vein: No evidence of thrombus. Normal compressibility, respiratory phasicity and response to augmentation. Calf Veins: No evidence of thrombus. Normal compressibility and flow on color Doppler imaging. Superficial Great Saphenous Vein: No evidence of thrombus. Normal compressibility. Venous Reflux:  None. Other Findings: No evidence of superficial thrombophlebitis or abnormal fluid collection. IMPRESSION: No evidence of left lower extremity deep venous thrombosis. Electronically Signed   By: Irish Lack M.D.   On: 11/22/2022 16:12     Family Communication: Patient understands and agrees with current care plan.  Disposition: Status is: Inpatient Remains inpatient appropriate because: sepsis work up, IV antibiotics, vanco monitoring  Planned Discharge Destination: Home    Time spent: 40 minutes  Author: Marcelino Duster, MD 11/24/2022 2:20 PM  For on call review [email or web address].

## 2022-11-24 NOTE — Plan of Care (Signed)

## 2022-11-24 NOTE — Progress Notes (Signed)
Discussed previous note regarding PICC line.  Pt. States "I don't want that.

## 2022-11-25 ENCOUNTER — Inpatient Hospital Stay: Payer: Medicare Other

## 2022-11-25 DIAGNOSIS — E669 Obesity, unspecified: Secondary | ICD-10-CM | POA: Diagnosis not present

## 2022-11-25 DIAGNOSIS — E876 Hypokalemia: Secondary | ICD-10-CM | POA: Diagnosis not present

## 2022-11-25 DIAGNOSIS — L0390 Cellulitis, unspecified: Secondary | ICD-10-CM | POA: Diagnosis not present

## 2022-11-25 DIAGNOSIS — L03116 Cellulitis of left lower limb: Secondary | ICD-10-CM | POA: Diagnosis not present

## 2022-11-25 LAB — CBC
HCT: 32.3 % — ABNORMAL LOW (ref 36.0–46.0)
Hemoglobin: 10.8 g/dL — ABNORMAL LOW (ref 12.0–15.0)
MCH: 30.7 pg (ref 26.0–34.0)
MCHC: 33.4 g/dL (ref 30.0–36.0)
MCV: 91.8 fL (ref 80.0–100.0)
Platelets: 195 10*3/uL (ref 150–400)
RBC: 3.52 MIL/uL — ABNORMAL LOW (ref 3.87–5.11)
RDW: 13.2 % (ref 11.5–15.5)
WBC: 14.8 10*3/uL — ABNORMAL HIGH (ref 4.0–10.5)
nRBC: 0 % (ref 0.0–0.2)

## 2022-11-25 LAB — BASIC METABOLIC PANEL
Anion gap: 7 (ref 5–15)
BUN: 10 mg/dL (ref 8–23)
CO2: 26 mmol/L (ref 22–32)
Calcium: 8.1 mg/dL — ABNORMAL LOW (ref 8.9–10.3)
Chloride: 103 mmol/L (ref 98–111)
Creatinine, Ser: 0.84 mg/dL (ref 0.44–1.00)
GFR, Estimated: >60 mL/min (ref >60)
Glucose, Bld: 112 mg/dL — ABNORMAL HIGH (ref 70–99)
Potassium: 3.4 mmol/L — ABNORMAL LOW (ref 3.5–5.1)
Sodium: 136 mmol/L (ref 135–145)

## 2022-11-25 MED ORDER — VANCOMYCIN HCL 1500 MG/300ML IV SOLN
1500.0000 mg | INTRAVENOUS | Status: DC
  Administered 2022-11-25 – 2022-11-26 (×2): 1500 mg via INTRAVENOUS
  Filled 2022-11-25 (×2): qty 300

## 2022-11-25 MED ORDER — POTASSIUM CHLORIDE 20 MEQ PO PACK
40.0000 meq | PACK | Freq: Once | ORAL | Status: AC
  Administered 2022-11-25: 40 meq via ORAL
  Filled 2022-11-25: qty 2

## 2022-11-25 NOTE — Care Management Important Message (Signed)
Important Message  Patient Details  Name: Marissa Hayes MRN: [id number] Date of Birth: February 03, 1956   Medicare Important Message Given:  Yes     Johnell Comings 11/25/2022, 11:13 AM

## 2022-11-25 NOTE — Plan of Care (Signed)

## 2022-11-25 NOTE — Progress Notes (Signed)
Pharmacy Antibiotic Note  Marissa Hayes is a 67 y.o. female admitted on 11/21/2022 with cellulitis.  Pharmacy has been consulted for cefepime and vancomycin dosing.  Plan:  1) adjust vancomycin 1500 mg IV Q 24 hrs. Goal AUC 400-550. Expected AUC: 501.2 SCr used: 0.84 mcg/mL Expected Cmin: 10.9  2) continue cefepime 2 grams IV every 8 hours   Height: 5\' 7"  (170.2 cm) Weight: 108 kg (238 lb 1.6 oz) IBW/kg (Calculated) : 61.6  Temp (24hrs), Avg:100.4 F (38 C), Min:99.7 F (37.6 C), Max:101 F (38.3 C)  Recent Labs  Lab 11/21/22 1809 11/21/22 2359 11/22/22 0618 11/23/22 0521 11/25/22 0511  WBC 16.4*  --  13.4* 12.7* 14.8*  CREATININE 0.94  --  1.04* 0.97 0.84  LATICACIDVEN 1.6 1.6  --   --   --     Estimated Creatinine Clearance: 80 mL/min (by C-G formula based on SCr of 0.84 mg/dL).    No Known Allergies  Antimicrobials this admission: vancomycin 9/5 >>  cefepime 9/6 >>   Microbiology results: 09/05 BCx: NGTD  Thank you for allowing pharmacy to be a part of this patient's care.  Burnis Medin, PharmD, BCPS 11/25/2022 7:57 AM

## 2022-11-26 DIAGNOSIS — E669 Obesity, unspecified: Secondary | ICD-10-CM | POA: Diagnosis not present

## 2022-11-26 DIAGNOSIS — L03116 Cellulitis of left lower limb: Secondary | ICD-10-CM | POA: Diagnosis not present

## 2022-11-26 DIAGNOSIS — L0390 Cellulitis, unspecified: Secondary | ICD-10-CM | POA: Diagnosis not present

## 2022-11-26 DIAGNOSIS — E876 Hypokalemia: Secondary | ICD-10-CM | POA: Diagnosis not present

## 2022-11-26 LAB — CBC
HCT: 31.2 % — ABNORMAL LOW (ref 36.0–46.0)
Hemoglobin: 10.5 g/dL — ABNORMAL LOW (ref 12.0–15.0)
MCH: 30.7 pg (ref 26.0–34.0)
MCHC: 33.7 g/dL (ref 30.0–36.0)
MCV: 91.2 fL (ref 80.0–100.0)
Platelets: 217 10*3/uL (ref 150–400)
RBC: 3.42 MIL/uL — ABNORMAL LOW (ref 3.87–5.11)
RDW: 13.2 % (ref 11.5–15.5)
WBC: 15.2 10*3/uL — ABNORMAL HIGH (ref 4.0–10.5)
nRBC: 0 % (ref 0.0–0.2)

## 2022-11-26 LAB — BASIC METABOLIC PANEL
Anion gap: 6 (ref 5–15)
BUN: 7 mg/dL — ABNORMAL LOW (ref 8–23)
CO2: 26 mmol/L (ref 22–32)
Calcium: 8.2 mg/dL — ABNORMAL LOW (ref 8.9–10.3)
Chloride: 104 mmol/L (ref 98–111)
Creatinine, Ser: 0.66 mg/dL (ref 0.44–1.00)
GFR, Estimated: >60 mL/min (ref >60)
Glucose, Bld: 109 mg/dL — ABNORMAL HIGH (ref 70–99)
Potassium: 3.3 mmol/L — ABNORMAL LOW (ref 3.5–5.1)
Sodium: 136 mmol/L (ref 135–145)

## 2022-11-26 LAB — CULTURE, BLOOD (ROUTINE X 2)
Culture: NO GROWTH
Culture: NO GROWTH
Special Requests: ADEQUATE

## 2022-11-26 MED ORDER — PIPERACILLIN-TAZOBACTAM 3.375 G IVPB
3.3750 g | Freq: Three times a day (TID) | INTRAVENOUS | Status: DC
  Administered 2022-11-26 – 2022-11-27 (×3): 3.375 g via INTRAVENOUS
  Filled 2022-11-26 (×3): qty 50

## 2022-11-26 MED ORDER — POTASSIUM CHLORIDE CRYS ER 20 MEQ PO TBCR
40.0000 meq | EXTENDED_RELEASE_TABLET | Freq: Once | ORAL | Status: AC
  Administered 2022-11-26: 40 meq via ORAL
  Filled 2022-11-26: qty 2

## 2022-11-26 NOTE — Progress Notes (Addendum)
Progress Note   Patient: Marissa Hayes:[id number] DOB: Feb 24, 1956 DOA: 11/21/2022     5 DOS: the patient was seen and examined on 11/26/2022   Brief hospital course: Marissa Hayes is a 67 y.o. female with medical history significant of cellulitis of the left upper extremity in the past, morbid obesity who presented to the ER with fever, shortness of breath, generalized aches and chills. Patient was found to have a temperature 101 pulse was 102 and leukocytosis white count of 16.4.  Admitted for sepsis secondary to left leg cellulitis started on broad spectrum antibiotics.  Assessment and Plan: Sepsis secondary to cellulitis:  Continue sepsis protocol. Lactic acid normal, WBC high Low grade fever last night. Continue broad spectrum antibiotics Vanco+  Cefepime changed to Zosyn per pharmacy protocol.   Cellulitis of left lower extremity:  ABI lower extremities unremarkable. Uls left lower extremity negative for DVT. Elevate the limb.  Continue broad spectrum antibiotics. Follow blood cultures.  Hypokalemia:  Daily oral supplements ordered.  Obesity Body mass index is 37.29 kg/m. Diet, exercise and weight reduction advised.     Subjective: Patient is seen and examined today morning. She is lying in bed, eating fair.  Tmax 100.4, redness, swelling of left leg worsening. Wishes to go home.  Physical Exam: Vitals:   11/25/22 2026 11/26/22 0431 11/26/22 0844 11/26/22 1556  BP: (!) 134/52 (!) 120/57 137/60 (!) 156/61  Pulse: 88 84 84 94  Resp: 20 20 20  (!) 24  Temp: (!) 100.4 F (38 C) 98.7 F (37.1 C) 98.4 F (36.9 C) 98.5 F (36.9 C)  TempSrc: Oral Oral Oral Oral  SpO2: 100% 97% 99% 99%  Weight:      Height:       General -Elderly African American female, distress due to pain. HEENT - PERRLA, EOMI, atraumatic head, non tender sinuses. Lung - clear, no rales, rhonchi. Heart - S1, S2 heard, no murmurs, rubs, trace pedal edema Neuro - Alert, awake and oriented x 3, non focal  exam. Skin - Warm and dry. Left leg swelling, redness from below knee worsened.  Data Reviewed:     Latest Ref Rng & Units 11/26/2022    5:19 AM 11/25/2022    5:11 AM 11/23/2022    5:21 AM  CBC  WBC 4.0 - 10.5 K/uL 15.2  14.8  12.7   Hemoglobin 12.0 - 15.0 g/dL 95.6  21.3  08.6   Hematocrit 36.0 - 46.0 % 31.2  32.3  32.9   Platelets 150 - 400 K/uL 217  195  164        Latest Ref Rng & Units 11/26/2022    5:19 AM 11/25/2022    5:11 AM 11/23/2022    5:21 AM  BMP  Glucose 70 - 99 mg/dL 578  469  629   BUN 8 - 23 mg/dL 7  10  16    Creatinine 0.44 - 1.00 mg/dL 5.28  4.13  2.44   Sodium 135 - 145 mmol/L 136  136  136   Potassium 3.5 - 5.1 mmol/L 3.3  3.4  3.5   Chloride 98 - 111 mmol/L 104  103  105   CO2 22 - 32 mmol/L 26  26  23    Calcium 8.9 - 10.3 mg/dL 8.2  8.1  8.1    US ARTERIAL ABI (SCREENING LOWER EXTREMITY)  Result Date: 11/26/2022 CLINICAL DATA:  Lower extremity edema EXAM: NONINVASIVE PHYSIOLOGIC VASCULAR STUDY OF BILATERAL LOWER EXTREMITIES TECHNIQUE: Evaluation of both lower extremities  were performed at rest, including calculation of ankle-brachial indices with single level Doppler, pressure and pulse volume recording. COMPARISON:  None Available. FINDINGS: Right ABI:  1.1 Left ABI:  1.1 Right Lower Extremity:  Normal arterial waveforms at the ankle. Left Lower Extremity:  Normal arterial waveforms at the ankle. 1.0-1.4 Normal IMPRESSION: Normal resting ankle-brachial indices. Signed, Sterling Big, MD, RPVI [facility] Radiology Electronically Signed   By: Malachy Moan M.D.   On: 11/26/2022 08:08     Family Communication: Patient understands and agrees with current care plan.  Disposition: Status is: Inpatient Remains inpatient appropriate because: sepsis work up, IV antibiotics, vanco monitoring  Planned Discharge Destination: Home    Time spent: 45 minutes  Author: Marcelino Duster, MD 11/26/2022 5:39  PM  For on call review [email or web address].

## 2022-11-26 NOTE — TOC Progression Note (Signed)
Transition of Care [facility]) - Progression Note    Patient Details  Name: Marissa Hayes MRN: [id number] Date of Birth: 1955/09/28  Transition of Care [facility]) CM/SW Contact  Chapman Fitch, RN Phone Number: 11/26/2022, 3:24 PM  Clinical Narrative:      Awaiting updated plan for Antibiotic regimen.  Patient still currently on IV antibiotics.  Please consult TOC if home IV antibiotics or other needs arise       Expected Discharge Plan and Services                                               Social Determinants of Health (SDOH) Interventions SDOH Screenings   Food Insecurity: No Food Insecurity (11/21/2022)  Housing: Low Risk  (11/21/2022)  Transportation Needs: No Transportation Needs (11/21/2022)  Utilities: Not At Risk (11/21/2022)  Depression (PHQ2-9): Low Risk  (11/13/2018)  Financial Resource Strain: Unknown (10/21/2018)  Physical Activity: Unknown (10/21/2018)  Social Connections: Unknown (10/21/2018)  Stress: Unknown (10/21/2018)  Tobacco Use: Low Risk  (11/21/2022)    Readmission Risk Interventions     No data to display

## 2022-11-26 NOTE — Plan of Care (Signed)

## 2022-11-26 NOTE — Progress Notes (Signed)
Progress Note   Patient: Marissa Hayes LKG:[id number] DOB: 1955-06-29 DOA: 11/21/2022     5 DOS: the patient was seen and examined on 11/26/2022   Brief hospital course: Marissa Hayes is a 67 y.o. female with medical history significant of cellulitis of the left upper extremity in the past, morbid obesity who presented to the ER with fever, shortness of breath, generalized aches and chills. Patient was found to have a temperature 101 pulse was 102 and leukocytosis white count of 16.4.  Admitted for sepsis secondary to left leg cellulitis staretd on broad spectrum antibiotics.  Assessment and Plan: Sepsis secondary to cellulitis:  Continue sepsis protocol. Lactic acid normal, WBC trending down. Low grade fever last night. Continue broad spectrum antibiotics Vanco+ cefepime, pharmacy to dose. As no improvement ordered ABI lower extremities.   Cellulitis of left lower extremity:  Uls left lower extremity negative for DVT. Elevate the limb.  Continue broad spectrum antibiotics. ABI lower extremities Follow blood cultures.  Hypokalemia:  Potassium improved. Will replete potassium as needed and recheck.  Obesity Body mass index is 37.29 kg/m. Diet, exercise and weight reduction advised.     Subjective: Patient is seen and examined today morning. She is lying in bed, eating fair. Left leg swelling persists. Not much improved on antibiotics.  Discussed with vascular  advised ABI lower extremities.  Physical Exam: Vitals:   11/25/22 2026 11/26/22 0431 11/26/22 0844 11/26/22 1556  BP: (!) 134/52 (!) 120/57 137/60 (!) 156/61  Pulse: 88 84 84 94  Resp: 20 20 20  (!) 24  Temp: (!) 100.4 F (38 C) 98.7 F (37.1 C) 98.4 F (36.9 C) 98.5 F (36.9 C)  TempSrc: Oral Oral Oral Oral  SpO2: 100% 97% 99% 99%  Weight:      Height:       General -Elderly African American female, distress due to pain. HEENT - PERRLA, EOMI, atraumatic head, non tender sinuses. Lung - clear, no rales,  rhonchi. Heart - S1, S2 heard, no murmurs, rubs, trace pedal edema Neuro - Alert, awake and oriented x 3, non focal exam. Skin - Warm and dry. Left leg swelling, redness from below knee slow improvement noted.  Data Reviewed:     Latest Ref Rng & Units 11/26/2022    5:19 AM 11/25/2022    5:11 AM 11/23/2022    5:21 AM  CBC  WBC 4.0 - 10.5 K/uL 15.2  14.8  12.7   Hemoglobin 12.0 - 15.0 g/dL 66.4  40.3  47.4   Hematocrit 36.0 - 46.0 % 31.2  32.3  32.9   Platelets 150 - 400 K/uL 217  195  164        Latest Ref Rng & Units 11/26/2022    5:19 AM 11/25/2022    5:11 AM 11/23/2022    5:21 AM  BMP  Glucose 70 - 99 mg/dL 259  563  875   BUN 8 - 23 mg/dL 7  10  16    Creatinine 0.44 - 1.00 mg/dL 6.43  3.29  5.18   Sodium 135 - 145 mmol/L 136  136  136   Potassium 3.5 - 5.1 mmol/L 3.3  3.4  3.5   Chloride 98 - 111 mmol/L 104  103  105   CO2 22 - 32 mmol/L 26  26  23    Calcium 8.9 - 10.3 mg/dL 8.2  8.1  8.1    US ARTERIAL ABI (SCREENING LOWER EXTREMITY)  Result Date: 11/26/2022 CLINICAL DATA:  Lower extremity edema  EXAM: NONINVASIVE PHYSIOLOGIC VASCULAR STUDY OF BILATERAL LOWER EXTREMITIES TECHNIQUE: Evaluation of both lower extremities were performed at rest, including calculation of ankle-brachial indices with single level Doppler, pressure and pulse volume recording. COMPARISON:  None Available. FINDINGS: Right ABI:  1.1 Left ABI:  1.1 Right Lower Extremity:  Normal arterial waveforms at the ankle. Left Lower Extremity:  Normal arterial waveforms at the ankle. 1.0-1.4 Normal IMPRESSION: Normal resting ankle-brachial indices. Signed, Sterling Big, MD, RPVI [facility] Radiology Electronically Signed   By: Malachy Moan M.D.   On: 11/26/2022 08:08     Family Communication: Patient understands and agrees with current care plan.  Disposition: Status is: Inpatient Remains inpatient appropriate because: sepsis work up, IV antibiotics, vanco  monitoring  Planned Discharge Destination: Home    Time spent: 39 minutes  Author: Marcelino Duster, MD 11/26/2022 5:41 PM  For on call review [email or web address].

## 2022-11-27 DIAGNOSIS — E876 Hypokalemia: Secondary | ICD-10-CM | POA: Diagnosis not present

## 2022-11-27 DIAGNOSIS — N179 Acute kidney failure, unspecified: Secondary | ICD-10-CM | POA: Insufficient documentation

## 2022-11-27 DIAGNOSIS — L03116 Cellulitis of left lower limb: Secondary | ICD-10-CM | POA: Diagnosis not present

## 2022-11-27 DIAGNOSIS — E669 Obesity, unspecified: Secondary | ICD-10-CM | POA: Diagnosis not present

## 2022-11-27 DIAGNOSIS — L0390 Cellulitis, unspecified: Secondary | ICD-10-CM | POA: Diagnosis not present

## 2022-11-27 DIAGNOSIS — A419 Sepsis, unspecified organism: Secondary | ICD-10-CM | POA: Diagnosis not present

## 2022-11-27 LAB — CBC
HCT: 31.3 % — ABNORMAL LOW (ref 36.0–46.0)
Hemoglobin: 10.4 g/dL — ABNORMAL LOW (ref 12.0–15.0)
MCH: 30.2 pg (ref 26.0–34.0)
MCHC: 33.2 g/dL (ref 30.0–36.0)
MCV: 91 fL (ref 80.0–100.0)
Platelets: 261 10*3/uL (ref 150–400)
RBC: 3.44 MIL/uL — ABNORMAL LOW (ref 3.87–5.11)
RDW: 13.1 % (ref 11.5–15.5)
WBC: 14.1 10*3/uL — ABNORMAL HIGH (ref 4.0–10.5)
nRBC: 0 % (ref 0.0–0.2)

## 2022-11-27 LAB — BASIC METABOLIC PANEL
Anion gap: 7 (ref 5–15)
BUN: 10 mg/dL (ref 8–23)
CO2: 28 mmol/L (ref 22–32)
Calcium: 8.1 mg/dL — ABNORMAL LOW (ref 8.9–10.3)
Chloride: 102 mmol/L (ref 98–111)
Creatinine, Ser: 0.79 mg/dL (ref 0.44–1.00)
GFR, Estimated: >60 mL/min (ref >60)
Glucose, Bld: 106 mg/dL — ABNORMAL HIGH (ref 70–99)
Potassium: 3.4 mmol/L — ABNORMAL LOW (ref 3.5–5.1)
Sodium: 137 mmol/L (ref 135–145)

## 2022-11-27 MED ORDER — CEFAZOLIN SODIUM-DEXTROSE 2-4 GM/100ML-% IV SOLN
2.0000 g | Freq: Three times a day (TID) | INTRAVENOUS | Status: DC
  Administered 2022-11-27 – 2022-11-28 (×3): 2 g via INTRAVENOUS
  Filled 2022-11-27 (×5): qty 100

## 2022-11-27 MED ORDER — POTASSIUM CHLORIDE 20 MEQ PO PACK
40.0000 meq | PACK | Freq: Every day | ORAL | Status: DC
  Administered 2022-11-27 – 2022-11-28 (×2): 40 meq via ORAL
  Filled 2022-11-27 (×3): qty 2

## 2022-11-27 MED ORDER — LINEZOLID 600 MG PO TABS
600.0000 mg | ORAL_TABLET | Freq: Two times a day (BID) | ORAL | Status: DC
  Administered 2022-11-27 – 2022-11-28 (×4): 600 mg via ORAL
  Filled 2022-11-27 (×5): qty 1

## 2022-11-27 NOTE — Progress Notes (Addendum)
Progress Note   Patient: Marissa Hayes:[id number] DOB: Jun 04, 1955 DOA: 11/21/2022     6 DOS: the patient was seen and examined on 11/27/2022   Brief hospital course: Marissa Hayes is a 67 y.o. female with medical history significant of cellulitis of the left upper extremity in the past, morbid obesity who presented to the ER with fever, shortness of breath, generalized aches and chills. Patient was found to have a temperature 101 pulse was 102 and leukocytosis white count of 16.4.  Admitted for sepsis secondary to left leg cellulitis started on broad spectrum antibiotics.  Assessment and Plan: Sepsis secondary to cellulitis:  WBC remains high, has low grade fever last night. Antibiotics changed to Cefazolin and Linezolid per ID recommendations.  Continue to follow blood cultures.   Cellulitis of left lower extremity:  ABI lower extremities unremarkable. Uls left lower extremity negative for DVT. Elevate the limb.  Continue Cefazolin+Linezolid. If no improvement or remains febrile will get MRI rule out abscess. Follow blood cultures.  Hypokalemia:  Daily oral supplements ordered.  AKI- Improved with IV hydration. Creatinine now at baseline. Continue to monitor daily renal function.  Obesity Body mass index is 37.29 kg/m. Diet, exercise and weight reduction advised.     Subjective: Patient is seen and examined today morning. She is lying in bed. Eating poor, has low appetite.  Tmax 100.4, redness persists, swelling of left leg better. She was on call with her work for leave of absence. Wishes to go home. Discussed with her the need to get IV antibiotics per ID.  Physical Exam: Vitals:   11/26/22 2024 11/27/22 0534 11/27/22 0806 11/27/22 1638  BP: (!) 141/57 136/69 (!) 124/53 117/73  Pulse: 84 77 76 83  Resp: 18 20 16 18   Temp: (!) 100.4 F (38 C) 99 F (37.2 C) 98.1 F (36.7 C) 98.7 F (37.1 C)  TempSrc: Oral Oral Oral Oral  SpO2: 99% 96% 98% 100%  Weight:      Height:        General -Elderly obese African American weak, ill female, no distress noted. HEENT - PERRLA, EOMI, atraumatic head, non tender sinuses. Lung - clear, no rales, rhonchi. Heart - S1, S2 heard, no murmurs, rubs, trace pedal edema Neuro - Alert, awake and oriented x 3, non focal exam. Skin - Warm and dry. Left leg swelling, redness from below knee.  Data Reviewed:     Latest Ref Rng & Units 11/27/2022    5:08 AM 11/26/2022    5:19 AM 11/25/2022    5:11 AM  CBC  WBC 4.0 - 10.5 K/uL 14.1  15.2  14.8   Hemoglobin 12.0 - 15.0 g/dL 24.4  01.0  27.2   Hematocrit 36.0 - 46.0 % 31.3  31.2  32.3   Platelets 150 - 400 K/uL 261  217  195        Latest Ref Rng & Units 11/27/2022    5:08 AM 11/26/2022    5:19 AM 11/25/2022    5:11 AM  BMP  Glucose 70 - 99 mg/dL 536  644  034   BUN 8 - 23 mg/dL 10  7  10    Creatinine 0.44 - 1.00 mg/dL 7.42  5.95  6.38   Sodium 135 - 145 mmol/L 137  136  136   Potassium 3.5 - 5.1 mmol/L 3.4  3.3  3.4   Chloride 98 - 111 mmol/L 102  104  103   CO2 22 - 32 mmol/L 28  26  26   Calcium 8.9 - 10.3 mg/dL 8.1  8.2  8.1    No results found.   Family Communication: Patient understands and agrees with current care plan.  Disposition: Status is: Inpatient Remains inpatient appropriate because: sepsis work up, IV antibiotics, vanco monitoring  Planned Discharge Destination: Home    Time spent: 45 minutes  Author: Marcelino Duster, MD 11/27/2022 4:59 PM  For on call review [email or web address].

## 2022-11-27 NOTE — Consult Note (Signed)
[facility] Customer service manager [facility]) Accountable Care Organization (ACO) [facility] Liaison Note  11/27/2022  [name] [birth year] [id number]  Location: [facility] RN Hospital Liaison screened the patient remotely at [facility].  Insurance: [employer]   Marissa Hayes is a 67 y.o. female who is a Primary Care Patient of Pcp, No. The patient was screened for readmission hospitalization with noted low risk score for unplanned readmission risk with 1 IP in 6 months.  The patient was assessed for potential [facility]) Care Management service needs for post hospital transition for care coordination. Review of patient's electronic medical record reveals patient Cellulitis. Pt does not have a PCP provider at this time and is not eligible for community care management services.  [facility] Care Management/Population Health does not replace or interfere with any arrangements made by the Inpatient Transition of Care team.   For questions contact:   Elliot Cousin, RN, [facility] Liaison [place]   Population Health Office Hours MTWF  8:00 am-6:00 pm [phone] mobile [phone] [Office toll free line] Office Hours are M-F 8:30 - 5 pm [email or web address]

## 2022-11-27 NOTE — Plan of Care (Signed)
  Problem: Fluid Volume: Goal: Hemodynamic stability will improve Outcome: Progressing   Problem: Clinical Measurements: Goal: Diagnostic test results will improve Outcome: Progressing   Problem: Clinical Measurements: Goal: Signs and symptoms of infection will decrease Outcome: Progressing   Problem: Respiratory: Goal: Ability to maintain adequate ventilation will improve Outcome: Progressing   Problem: Education: Goal: Knowledge of General Education information will improve Description: Including pain rating scale, medication(s)/side effects and non-pharmacologic comfort measures Outcome: Progressing

## 2022-11-27 NOTE — Consult Note (Signed)
[facility]  Total days of antibiotics 7 Reason for Consult: cellulitis of left leg   Referring Physician: sreeram  Principal Problem:   Sepsis due to cellulitis [facility]) Active Problems:   Cellulitis of left leg   Hypokalemia   Obesity (BMI 30-39.9)    HPI: Marissa Hayes is a 67 y.o. female admitted on 9/5 for weakness, high fevers, myalgias and found to have fever of 102F, leukocytosis  of 16K with left shift but physical exam was concerning for left lower leg cellulitis with edeam. She states that she has at baseline some asymmetric swelling to her left leg in comparison to right leg. She was hospitalized in 2020 for cellulitis to the last leg. She was started on vancomycin, and cefepime with not significant improvement in erythema but slight decreased swelling. Still having fevers up to 100.4-101F in the last 48hrs. Due to slow response, ID was asked to weight in on management  Infectious work up -  Blood cx NGTD from 9./5; CXR normal: U/S ruled out DVT; normal ABI   History reviewed. No pertinent past medical history.  Allergies: No Known Allergies  MEDICATIONS:  enoxaparin (LOVENOX) injection  50 mg Subcutaneous Q24H   linezolid  600 mg Oral Q12H    Social History   Tobacco Use   Smoking status: Never   Smokeless tobacco: Never  Vaping Use   Vaping status: Never Used  Substance Use Topics   Alcohol use: Never   Drug use: Never    History reviewed. No pertinent family history.  Review of Systems -  Left leg pain, and swelling - otherwise 12 point ros  OBJECTIVE: Temp:  [98.1 F (36.7 C)-100.4 F (38 C)] 98.1 F (36.7 C) (09/11 0806) Pulse Rate:  [76-94] 76 (09/11 0806) Resp:  [16-24] 16 (09/11 0806) BP: (124-156)/(53-69) 124/53 (09/11 0806) SpO2:  [96 %-99 %] 98 % (09/11 0806) Physical Exam  Constitutional: He is oriented to person, place, and time. He appears well-developed and well-nourished. No distress.  HENT:  Mouth/Throat:  Oropharynx is clear and moist. No oropharyngeal exudate.  Cardiovascular: Normal rate, regular rhythm and normal heart sounds. Exam reveals no gallop and no friction rub.  No murmur heard.  Pulmonary/Chest: Effort normal and breath sounds normal. No respiratory distress. He has no wheezes.  Abdominal: Soft. Bowel sounds are normal. He exhibits no distension. There is no tenderness.  Ext: left leg swelling especially to dorsum of foot with erythema slightly lesser than outline Neurological: He is alert and oriented to person, place, and time.  Skin: Skin is warm and dry. No rash noted. No erythema.  Psychiatric: He has a normal mood and affect. His behavior is normal.    LABS: Results for orders placed or performed during the hospital encounter of 11/21/22 (from the past 48 hour(s))  CBC     Status: Abnormal   Collection Time: 11/26/22  5:19 AM  Result Value Ref Range   WBC 15.2 (H) 4.0 - 10.5 K/uL   RBC 3.42 (L) 3.87 - 5.11 MIL/uL   Hemoglobin 10.5 (L) 12.0 - 15.0 g/dL   HCT 86.5 (L) 78.4 - 69.6 %   MCV 91.2 80.0 - 100.0 fL   MCH 30.7 26.0 - 34.0 pg   MCHC 33.7 30.0 - 36.0 g/dL   RDW 29.5 28.4 - 13.2 %   Platelets 217 150 - 400 K/uL   nRBC 0.0 0.0 - 0.2 %    Comment: Performed at [facility],  [address] [place], [place] [address]  Basic metabolic panel     Status: Abnormal   Collection Time: 11/26/22  5:19 AM  Result Value Ref Range   Sodium 136 135 - 145 mmol/L   Potassium 3.3 (L) 3.5 - 5.1 mmol/L   Chloride 104 98 - 111 mmol/L   CO2 26 22 - 32 mmol/L   Glucose, Bld 109 (H) 70 - 99 mg/dL    Comment: Glucose reference range applies only to samples taken after fasting for at least 8 hours.   BUN 7 (L) 8 - 23 mg/dL   Creatinine, Ser 6.04 0.44 - 1.00 mg/dL   Calcium 8.2 (L) 8.9 - 10.3 mg/dL   GFR, Estimated >54 >09 mL/min    Comment: (NOTE) Calculated using the CKD-EPI Creatinine Equation (2021)    Anion gap 6 5 - 15    Comment: Performed at [facility], [address] [place], [place] [address]  CBC     Status: Abnormal   Collection Time: 11/27/22  5:08 AM  Result Value Ref Range   WBC 14.1 (H) 4.0 - 10.5 K/uL   RBC 3.44 (L) 3.87 - 5.11 MIL/uL   Hemoglobin 10.4 (L) 12.0 - 15.0 g/dL   HCT 47.8 (L) 29.5 - 62.1 %   MCV 91.0 80.0 - 100.0 fL   MCH 30.2 26.0 - 34.0 pg   MCHC 33.2 30.0 - 36.0 g/dL   RDW 30.8 65.7 - 84.6 %   Platelets 261 150 - 400 K/uL   nRBC 0.0 0.0 - 0.2 %    Comment: Performed at [facility], [address] [place], [place] [address]  Basic metabolic panel     Status: Abnormal   Collection Time: 11/27/22  5:08 AM  Result Value Ref Range   Sodium 137 135 - 145 mmol/L   Potassium 3.4 (L) 3.5 - 5.1 mmol/L   Chloride 102 98 - 111 mmol/L   CO2 28 22 - 32 mmol/L   Glucose, Bld 106 (H) 70 - 99 mg/dL    Comment: Glucose reference range applies only to samples taken after fasting for at least 8 hours.   BUN 10 8 - 23 mg/dL   Creatinine, Ser 2.84 0.44 - 1.00 mg/dL   Calcium 8.1 (L) 8.9 - 10.3 mg/dL   GFR, Estimated >13 >24 mL/min    Comment: (NOTE) Calculated using the CKD-EPI Creatinine Equation (2021)    Anion gap 7 5 - 15    Comment: Performed at [facility], [address] [place], [place] [address]    MICRO: reviewed IMAGING: US ARTERIAL ABI (SCREENING LOWER EXTREMITY)  Result Date: 11/26/2022 CLINICAL DATA:  Lower extremity edema EXAM: NONINVASIVE PHYSIOLOGIC VASCULAR STUDY OF BILATERAL LOWER EXTREMITIES TECHNIQUE: Evaluation of both lower extremities were performed at rest, including calculation of ankle-brachial indices with single level Doppler, pressure and pulse volume recording. COMPARISON:  None Available. FINDINGS: Right ABI:  1.1 Left ABI:  1.1 Right Lower Extremity:  Normal arterial waveforms at the ankle. Left Lower Extremity:  Normal arterial waveforms at the ankle. 1.0-1.4 Normal IMPRESSION: Normal resting ankle-brachial indices. Signed, Sterling Big, MD, RPVI  [facility] Radiology Electronically Signed   By: Malachy Moan M.D.   On: 11/26/2022 08:08     Assessment/Plan:  67yo F with left leg cellulitis, non purulent, slow to respond after 7 days of iv abtx - recommend to change to abtx to cefazolin 2gm IV q8hr plus linezolid 600mg   po bid ( for 2-3 days ) - if still febrile today, recommend imaging with mri to left leg to see if any deep abscess - she must elevated her leg at least 2-3 pillows up when in bed. Legs up in in chair - once showing some improvement, would recommend some gentle pressure wrapping to help with pedal edema  Leukocytosis = anticipate to improve with change of abtx and response to treatment   Will check in on her tomorrow to see if improved Marissa Hayes B. Drue Second MD MPH [facility] [phone]

## 2022-11-27 NOTE — Plan of Care (Signed)
  Problem: Clinical Measurements: Goal: Diagnostic test results will improve Outcome: Progressing Goal: Signs and symptoms of infection will decrease Outcome: Progressing   Problem: Respiratory: Goal: Ability to maintain adequate ventilation will improve Outcome: Progressing   Problem: Education: Goal: Knowledge of General Education information will improve Description: Including pain rating scale, medication(s)/side effects and non-pharmacologic comfort measures Outcome: Progressing

## 2022-11-28 DIAGNOSIS — L03116 Cellulitis of left lower limb: Secondary | ICD-10-CM | POA: Diagnosis not present

## 2022-11-28 DIAGNOSIS — L0390 Cellulitis, unspecified: Secondary | ICD-10-CM | POA: Diagnosis not present

## 2022-11-28 DIAGNOSIS — N179 Acute kidney failure, unspecified: Secondary | ICD-10-CM | POA: Diagnosis not present

## 2022-11-28 DIAGNOSIS — E669 Obesity, unspecified: Secondary | ICD-10-CM | POA: Diagnosis not present

## 2022-11-28 LAB — CBC WITH DIFFERENTIAL/PLATELET
Abs Immature Granulocytes: 0.27 10*3/uL — ABNORMAL HIGH (ref 0.00–0.07)
Basophils Absolute: 0.1 10*3/uL (ref 0.0–0.1)
Basophils Relative: 1 %
Eosinophils Absolute: 0.4 10*3/uL (ref 0.0–0.5)
Eosinophils Relative: 3 %
HCT: 33 % — ABNORMAL LOW (ref 36.0–46.0)
Hemoglobin: 10.8 g/dL — ABNORMAL LOW (ref 12.0–15.0)
Immature Granulocytes: 2 %
Lymphocytes Relative: 17 %
Lymphs Abs: 2 10*3/uL (ref 0.7–4.0)
MCH: 30 pg (ref 26.0–34.0)
MCHC: 32.7 g/dL (ref 30.0–36.0)
MCV: 91.7 fL (ref 80.0–100.0)
Monocytes Absolute: 1 10*3/uL (ref 0.1–1.0)
Monocytes Relative: 8 %
Neutro Abs: 7.9 10*3/uL — ABNORMAL HIGH (ref 1.7–7.7)
Neutrophils Relative %: 69 %
Platelets: 325 10*3/uL (ref 150–400)
RBC: 3.6 MIL/uL — ABNORMAL LOW (ref 3.87–5.11)
RDW: 13.2 % (ref 11.5–15.5)
Smear Review: NORMAL
WBC: 11.6 10*3/uL — ABNORMAL HIGH (ref 4.0–10.5)
nRBC: 0 % (ref 0.0–0.2)

## 2022-11-28 MED ORDER — POLYETHYLENE GLYCOL 3350 17 G PO PACK
17.0000 g | PACK | Freq: Every day | ORAL | Status: DC
  Administered 2022-11-28: 17 g via ORAL
  Filled 2022-11-28 (×2): qty 1

## 2022-11-28 MED ORDER — CEFAZOLIN SODIUM-DEXTROSE 2-4 GM/100ML-% IV SOLN
2.0000 g | Freq: Three times a day (TID) | INTRAVENOUS | Status: DC
  Administered 2022-11-29 (×2): 2 g via INTRAVENOUS
  Filled 2022-11-28 (×3): qty 100

## 2022-11-28 MED ORDER — SENNA 8.6 MG PO TABS
2.0000 | ORAL_TABLET | Freq: Every day | ORAL | Status: DC
  Administered 2022-11-28: 17.2 mg via ORAL
  Filled 2022-11-28: qty 2

## 2022-11-28 MED ORDER — CEFAZOLIN SODIUM-DEXTROSE 2-4 GM/100ML-% IV SOLN
2.0000 g | Freq: Three times a day (TID) | INTRAVENOUS | Status: DC
  Administered 2022-11-28: 2 g via INTRAVENOUS
  Filled 2022-11-28 (×2): qty 100

## 2022-11-28 MED ORDER — DOCUSATE SODIUM 100 MG PO CAPS
100.0000 mg | ORAL_CAPSULE | Freq: Two times a day (BID) | ORAL | Status: DC
  Administered 2022-11-28: 100 mg via ORAL
  Filled 2022-11-28 (×3): qty 1

## 2022-11-28 NOTE — Plan of Care (Signed)
Per patient last BM 11/20/22. Patient normal bowel habits is daily PTA. Updated MD.    Problem: Elimination: Goal: Will not experience complications related to bowel motility Outcome: Not Progressing   Problem: Fluid Volume: Goal: Hemodynamic stability will improve Outcome: Progressing   Problem: Activity: Goal: Risk for activity intolerance will decrease Outcome: Progressing   Problem: Nutrition: Goal: Adequate nutrition will be maintained Outcome: Progressing   Problem: Pain Managment: Goal: General experience of comfort will improve Outcome: Progressing   Problem: Safety: Goal: Ability to remain free from injury will improve Outcome: Progressing   Problem: Skin Integrity: Goal: Risk for impaired skin integrity will decrease Outcome: Progressing

## 2022-11-28 NOTE — Progress Notes (Signed)
Mobility Specialist - Progress Note   11/28/22 1109  Mobility  Activity Ambulated with assistance in room;Stood at bedside  Level of Assistance Contact guard assist, steadying assist  Assistive Device None  Distance Ambulated (ft) 4 ft  Activity Response Tolerated well  $Mobility charge 1 Mobility  Mobility Specialist Start Time (ACUTE ONLY) 1042  Mobility Specialist Stop Time (ACUTE ONLY) 1108  Mobility Specialist Time Calculation (min) (ACUTE ONLY) 26 min   Pt supine upon entry, utilizing RA. Pt expressed no pain while supine. Pt completed bed mob indep, STS x3 ModI; unable to come to full standing requiring MinA on the fourth attempt. Pt took ~3 forward/backward steps CGA-MinA, expressing pain in the LLE during amb. Pt returned to bed, left supine with alarm set and needs within reach.  Zetta Bills Mobility Specialist 11/28/22 11:19 AM

## 2022-11-28 NOTE — Plan of Care (Signed)
  Problem: Fluid Volume: Goal: Hemodynamic stability will improve Outcome: Progressing   Problem: Health Behavior/Discharge Planning: Goal: Ability to manage health-related needs will improve Outcome: Progressing   Problem: Education: Goal: Knowledge of General Education information will improve Description: Including pain rating scale, medication(s)/side effects and non-pharmacologic comfort measures Outcome: Progressing   Problem: Activity: Goal: Risk for activity intolerance will decrease Outcome: Progressing

## 2022-11-28 NOTE — Care Management Important Message (Signed)
Important Message  Patient Details  Name: Marissa Hayes MRN: [id number] Date of Birth: 1955-03-28   Medicare Important Message Given:  Yes     Johnell Comings 11/28/2022, 1:27 PM

## 2022-11-28 NOTE — Progress Notes (Signed)
Progress Note   Patient: Marissa Hayes UUV:[id number] DOB: Apr 13, 1955 DOA: 11/21/2022     7 DOS: the patient was seen and examined on 11/28/2022   Brief hospital course: Marissa Hayes is a 67 y.o. female with medical history significant of cellulitis of the left upper extremity in the past, morbid obesity who presented to the ER with fever, shortness of breath, generalized aches and chills. Patient was found to have a temperature 101 pulse was 102 and leukocytosis white count of 16.4.  Admitted for sepsis secondary to left leg cellulitis started on broad spectrum antibiotics. She was given Vancomycin+Cefepime, later changed to Vanco and Zosyn. Clinical improvement not seen, ID recommended Cefazolin+Linezolid.   Assessment and Plan: Sepsis secondary to cellulitis:  WBC improved, afebrile last night. Continue Cefazolin and Linezolid per ID recommendations.  Blood cultures so far negative.   Cellulitis of left lower extremity:  Left swelling, redness improved today. Elevate the limb.  Continue Cefazolin+Linezolid. ABI lower extremities unremarkable. Uls left lower extremity negative for DVT. I discussed with her the need for IV antibiotics. She was admitted 4 years ago for leg cellulitis and has to come 3 times to ED. She understands.  Hypokalemia:  Daily oral supplements ordered.  AKI- Improved with IV hydration. Creatinine now at baseline. Continue to monitor daily renal function.  Obesity Body mass index is 37.29 kg/m. Diet, exercise and weight reduction advised.     Subjective: Patient is seen and examined today morning. She has pain with ambulation, but redness, swelling improved. Wishes to go out of room in wheel chair. Eating better. Afebrile since 1 day.  Physical Exam: Vitals:   11/27/22 1638 11/27/22 2208 11/28/22 0318 11/28/22 0742  BP: 117/73 121/72 120/72 (!) 128/58  Pulse: 83 79 80 70  Resp: 18 16 16 16   Temp: 98.7 F (37.1 C) 98.9 F (37.2 C) 98.9 F (37.2 C) 98.4  F (36.9 C)  TempSrc: Oral     SpO2: 100% 95% 97% 98%  Weight:      Height:       General -Elderly obese African American female, no distress noted. HEENT - PERRLA, EOMI, atraumatic head, non tender sinuses. Lung - clear, no rales, rhonchi. Heart - S1, S2 heard, no murmurs, rubs, trace pedal edema Neuro - Alert, awake and oriented x 3, non focal exam. Skin - Warm and dry. Left leg swelling, redness extending behind knee, improved.  Data Reviewed:     Latest Ref Rng & Units 11/28/2022    4:25 AM 11/27/2022    5:08 AM 11/26/2022    5:19 AM  CBC  WBC 4.0 - 10.5 K/uL 11.6  14.1  15.2   Hemoglobin 12.0 - 15.0 g/dL 47.4  25.9  56.3   Hematocrit 36.0 - 46.0 % 33.0  31.3  31.2   Platelets 150 - 400 K/uL 325  261  217        Latest Ref Rng & Units 11/27/2022    5:08 AM 11/26/2022    5:19 AM 11/25/2022    5:11 AM  BMP  Glucose 70 - 99 mg/dL 875  643  329   BUN 8 - 23 mg/dL 10  7  10    Creatinine 0.44 - 1.00 mg/dL 5.18  8.41  6.60   Sodium 135 - 145 mmol/L 137  136  136   Potassium 3.5 - 5.1 mmol/L 3.4  3.3  3.4   Chloride 98 - 111 mmol/L 102  104  103   CO2 22 -  32 mmol/L 28  26  26    Calcium 8.9 - 10.3 mg/dL 8.1  8.2  8.1    No results found.   Family Communication: Patient understands and agrees with current care plan. She wishes to go home but understands the need of IV antibiotics.  Disposition: Status is: Inpatient Remains inpatient appropriate because: IV antibiotics, monitoring leg swelling, redness worsening  Planned Discharge Destination: Home    Time spent: 45 minutes  Author: Marcelino Duster, MD 11/28/2022 11:36 AM  For on call review [email or web address].

## 2022-11-28 NOTE — TOC Progression Note (Signed)
Transition of Care [facility]) - Progression Note    Patient Details  Name: Marissa Hayes MRN: [id number] Date of Birth: 1955/11/09  Transition of [facility]) CM/SW Contact  Chapman Fitch, RN Phone Number: 11/28/2022, 3:53 PM  Clinical Narrative:     Limited Mobility noted with Mobility tech.  Secure message sent to MD to determine if he felt like a PT eval is indicated        Expected Discharge Plan and Services                                               Social Determinants of Health (SDOH) Interventions SDOH Screenings   Food Insecurity: No Food Insecurity (11/21/2022)  Housing: Low Risk  (11/21/2022)  Transportation Needs: No Transportation Needs (11/21/2022)  Utilities: Not At Risk (11/21/2022)  Depression (PHQ2-9): Low Risk  (11/13/2018)  Financial Resource Strain: Unknown (10/21/2018)  Physical Activity: Unknown (10/21/2018)  Social Connections: Unknown (10/21/2018)  Stress: Unknown (10/21/2018)  Tobacco Use: Low Risk  (11/21/2022)    Readmission Risk Interventions     No data to display

## 2022-11-29 DIAGNOSIS — L0390 Cellulitis, unspecified: Secondary | ICD-10-CM | POA: Diagnosis not present

## 2022-11-29 DIAGNOSIS — L03116 Cellulitis of left lower limb: Secondary | ICD-10-CM | POA: Diagnosis not present

## 2022-11-29 DIAGNOSIS — A419 Sepsis, unspecified organism: Secondary | ICD-10-CM | POA: Diagnosis not present

## 2022-11-29 DIAGNOSIS — E876 Hypokalemia: Secondary | ICD-10-CM | POA: Diagnosis not present

## 2022-11-29 DIAGNOSIS — N179 Acute kidney failure, unspecified: Secondary | ICD-10-CM | POA: Diagnosis not present

## 2022-11-29 LAB — POTASSIUM: Potassium: 3.5 mmol/L (ref 3.5–5.1)

## 2022-11-29 MED ORDER — POLYETHYLENE GLYCOL 3350 17 G PO PACK: 17.0000 g | PACK | Freq: Every day | ORAL | 0 refills | Status: AC

## 2022-11-29 MED ORDER — SENNA 8.6 MG PO TABS: 2.0000 | ORAL_TABLET | Freq: Every day | ORAL | 0 refills | Status: DC

## 2022-11-29 MED ORDER — DOCUSATE SODIUM 100 MG PO CAPS: 100.0000 mg | ORAL_CAPSULE | Freq: Two times a day (BID) | ORAL | 0 refills | Status: DC

## 2022-11-29 MED ORDER — POTASSIUM CHLORIDE CRYS ER 20 MEQ PO TBCR: 40.0000 meq | EXTENDED_RELEASE_TABLET | Freq: Every day | ORAL | 0 refills | Status: AC

## 2022-11-29 MED ORDER — CEFADROXIL 500 MG PO CAPS: 1000.0000 mg | ORAL_CAPSULE | Freq: Two times a day (BID) | ORAL | 0 refills | Status: AC

## 2022-11-29 MED ORDER — CEFADROXIL 500 MG PO CAPS
1000.0000 mg | ORAL_CAPSULE | Freq: Two times a day (BID) | ORAL | Status: DC
  Filled 2022-11-29: qty 2

## 2022-11-29 NOTE — Evaluation (Signed)
Physical Therapy Evaluation & Discharge  Patient Details Name: Marissa Hayes MRN: [id number] DOB: September 25, 1955 Today's Date: 11/29/2022  History of Present Illness  67 y/o female presented to ED on 11/21/22 for SOB, generalized aches, and chills. Admitted for sepsis 2/2 L LE cellulitis. PMH: hx of L UE cellulitis, obesity  Clinical Impression  Patient admitted with the above. PTA, patient lives with husband whom is bedridden as well as her children who assist her with husband's care. Typically independent with no AD and working. Patient presents with weakness and impaired balance. Able to stand from EOB with use of momentum modI. Ambulated within room with supervision and no AD with LOBx1 but able to self recover. Patient requesting no follow up PT or equipment and feels as if she can manage on her own. Discussed PT following while hospitalized and patient declined. PT will complete orders at this time. Patient will have necessary assistance at discharge by family.         If plan is discharge home, recommend the following: A little help with walking and/or transfers;Assistance with cooking/housework;Help with stairs or ramp for entrance   Can travel by private vehicle        Equipment Recommendations None recommended by PT  Recommendations for Other Services       Functional Status Assessment Patient has had a recent decline in their functional status and demonstrates the ability to make significant improvements in function in a reasonable and predictable amount of time.     Precautions / Restrictions Precautions Precautions: Fall Restrictions Weight Bearing Restrictions: No      Mobility  Bed Mobility Overal bed mobility: Modified Independent                  Transfers Overall transfer level: Modified independent                 General transfer comment: use of momentum and 2-3 attempts before coming into standing from low bed surface     Ambulation/Gait Ambulation/Gait assistance: Supervision Gait Distance (Feet): 20 Feet Assistive device: None Gait Pattern/deviations: Step-to pattern, Decreased stride length, Decreased stance time - left Gait velocity: decreased     General Gait Details: supervision for safety. LOB x 1 but able to self recover  Stairs            Wheelchair Mobility     Tilt Bed    Modified Rankin (Stroke Patients Only)       Balance Overall balance assessment: Mild deficits observed, not formally tested                                           Pertinent Vitals/Pain Pain Assessment Pain Assessment: Faces Faces Pain Scale: Hurts little more Pain Location: LLE Pain Descriptors / Indicators: Discomfort Pain Intervention(s): Monitored during session    Home Living Family/patient expects to be discharged to:: Private residence Living Arrangements: Spouse/significant other;Children Available Help at Discharge: Family;Available 24 hours/day Type of Home: House Home Access: Level entry       Home Layout: One level Home Equipment: Agricultural consultant (2 wheels);Cane - single point;BSC/3in1;Shower seat      Prior Function Prior Level of Function : Independent/Modified Independent;Working/employed                     Extremity/Trunk Assessment   Upper Extremity Assessment Upper Extremity Assessment: Overall [facility]  for tasks assessed    Lower Extremity Assessment Lower Extremity Assessment: Generalized weakness    Cervical / Trunk Assessment Cervical / Trunk Assessment: Normal  Communication   Communication Communication: No apparent difficulties  Cognition Arousal: Alert Behavior During Therapy: WFL for tasks assessed/performed Overall Cognitive Status: Within Functional Limits for tasks assessed                                          General Comments      Exercises     Assessment/Plan    PT Assessment Patient does not  need any further PT services  PT Problem List         PT Treatment Interventions      PT Goals (Current goals can be found in the Care Plan section)  Acute Rehab PT Goals Patient Stated Goal: to go home PT Goal Formulation: All assessment and education complete, DC therapy    Frequency       Co-evaluation               AM-PAC PT "6 Clicks" Mobility  Outcome Measure Help needed turning from your back to your side while in a flat bed without using bedrails?: None Help needed moving from lying on your back to sitting on the side of a flat bed without using bedrails?: None Help needed moving to and from a bed to a chair (including a wheelchair)?: None Help needed standing up from a chair using your arms (e.g., wheelchair or bedside chair)?: None Help needed to walk in hospital room?: A Little Help needed climbing 3-5 steps with a railing? : A Little 6 Click Score: 22    End of Session   Activity Tolerance: Patient tolerated treatment well Patient left: in bed;with call bell/phone within reach Nurse Communication: Mobility status PT Visit Diagnosis: Unsteadiness on feet (R26.81);Muscle weakness (generalized) (M62.81)    Time: 1191-4782 PT Time Calculation (min) (ACUTE ONLY): 16 min   Charges:   PT Evaluation $PT Eval Low Complexity: 1 Low   PT General Charges $$ ACUTE PT VISIT: 1 Visit         Maylon Peppers, PT, DPT Physical Therapist - [facility]  [facility]   Jayston Trevino A Solimar Maiden 11/29/2022, 9:17 AM

## 2022-11-29 NOTE — Plan of Care (Signed)

## 2022-11-29 NOTE — Plan of Care (Signed)
  Problem: Fluid Volume: Goal: Hemodynamic stability will improve Outcome: Progressing   Problem: Clinical Measurements: Goal: Diagnostic test results will improve Outcome: Progressing Goal: Signs and symptoms of infection will decrease Outcome: Progressing   Problem: Elimination: Goal: Will not experience complications related to bowel motility Outcome: Progressing   Problem: Skin Integrity: Goal: Risk for impaired skin integrity will decrease Outcome: Progressing

## 2022-11-29 NOTE — Progress Notes (Signed)
Subjective: No new complaints   Antibiotics:  Anti-infectives (From admission, onward)    Start     Dose/Rate Route Frequency Ordered Stop   11/29/22 0330  ceFAZolin (ANCEF) IVPB 2g/100 mL premix        2 g 200 mL/hr over 30 Minutes Intravenous Every 8 hours 11/28/22 2015     11/28/22 1618  ceFAZolin (ANCEF) IVPB 2g/100 mL premix  Status:  Discontinued        2 g 200 mL/hr over 30 Minutes Intravenous Every 8 hours 11/28/22 1618 11/28/22 2015   11/27/22 1400  ceFAZolin (ANCEF) IVPB 2g/100 mL premix  Status:  Discontinued        2 g 200 mL/hr over 30 Minutes Intravenous Every 8 hours 11/27/22 1237 11/28/22 1618   11/27/22 1400  linezolid (ZYVOX) tablet 600 mg        600 mg Oral Every 12 hours 11/27/22 1237     11/26/22 1400  piperacillin-tazobactam (ZOSYN) IVPB 3.375 g  Status:  Discontinued        3.375 g 12.5 mL/hr over 240 Minutes Intravenous Every 8 hours 11/26/22 1054 11/27/22 1237   11/25/22 2200  vancomycin (VANCOREADY) IVPB 1500 mg/300 mL  Status:  Discontinued        1,500 mg 150 mL/hr over 120 Minutes Intravenous Every 24 hours 11/25/22 1029 11/27/22 1237   11/23/22 2200  vancomycin (VANCOREADY) IVPB 1250 mg/250 mL  Status:  Discontinued        1,250 mg 166.7 mL/hr over 90 Minutes Intravenous Every 24 hours 11/22/22 2101 11/22/22 2112   11/23/22 0600  ceFEPIme (MAXIPIME) 2 g in sodium chloride 0.9 % 100 mL IVPB  Status:  Discontinued        2 g 200 mL/hr over 30 Minutes Intravenous Every 8 hours 11/22/22 2059 11/26/22 1054   11/22/22 2200  vancomycin (VANCOREADY) IVPB 1250 mg/250 mL  Status:  Discontinued        1,250 mg 166.7 mL/hr over 90 Minutes Intravenous Every 24 hours 11/22/22 2112 11/25/22 1029   11/22/22 2145  vancomycin (VANCOCIN) IVPB 1000 mg/200 mL premix  Status:  Discontinued        1,000 mg 200 mL/hr over 60 Minutes Intravenous  Once 11/22/22 2053 11/22/22 2056   11/22/22 2145  ceFEPIme (MAXIPIME) 2 g in sodium chloride 0.9 % 100 mL IVPB         2 g 200 mL/hr over 30 Minutes Intravenous  Once 11/22/22 2053 11/22/22 2238   11/22/22 2000  cefTRIAXone (ROCEPHIN) 2 g in sodium chloride 0.9 % 100 mL IVPB  Status:  Discontinued        2 g 200 mL/hr over 30 Minutes Intravenous Every 24 hours 11/21/22 2219 11/22/22 2053   11/21/22 1915  vancomycin (VANCOCIN) IVPB 1000 mg/200 mL premix  Status:  Discontinued        1,000 mg 200 mL/hr over 60 Minutes Intravenous  Once 11/21/22 1906 11/21/22 1910   11/21/22 1915  ceFEPIme (MAXIPIME) 2 g in sodium chloride 0.9 % 100 mL IVPB        2 g 200 mL/hr over 30 Minutes Intravenous  Once 11/21/22 1906 11/21/22 1954   11/21/22 1915  vancomycin (VANCOREADY) IVPB 2000 mg/400 mL        2,000 mg 200 mL/hr over 120 Minutes Intravenous  Once 11/21/22 1910 11/21/22 2301       Medications: Scheduled Meds:  docusate sodium  100 mg Oral BID  enoxaparin (LOVENOX) injection  50 mg Subcutaneous Q24H   linezolid  600 mg Oral Q12H   polyethylene glycol  17 g Oral Daily   potassium chloride  40 mEq Oral Daily   senna  2 tablet Oral QHS   Continuous Infusions:  sodium chloride 10 mL/hr at 11/25/22 0509    ceFAZolin (ANCEF) IV 2 g (11/29/22 0436)   PRN Meds:.sodium chloride, acetaminophen **OR** acetaminophen, ondansetron **OR** ondansetron (ZOFRAN) IV    Objective: Weight change:   Intake/Output Summary (Last 24 hours) at 11/29/2022 0958 Last data filed at 11/29/2022 0710 Gross per 24 hour  Intake 320 ml  Output 400 ml  Net -80 ml   Blood pressure (!) 120/59, pulse 68, temperature 98.2 F (36.8 C), temperature source Oral, resp. rate 18, height 5\' 7"  (1.702 m), weight 108 kg, SpO2 98%. Temp:  [98 F (36.7 C)-99 F (37.2 C)] 98.2 F (36.8 C) (09/13 0738) Pulse Rate:  [64-83] 68 (09/13 0738) Resp:  [16-18] 18 (09/13 0738) BP: (114-120)/(55-62) 120/59 (09/13 0738) SpO2:  [97 %-100 %] 98 % (09/13 0738)  Physical Exam: Physical Exam Constitutional:      General: She is not in acute  distress.    Appearance: She is well-developed. She is not diaphoretic.  HENT:     Head: Normocephalic and atraumatic.     Right Ear: External ear normal.     Left Ear: External ear normal.     Mouth/Throat:     Pharynx: No oropharyngeal exudate.  Eyes:     General: No scleral icterus.    Conjunctiva/sclera: Conjunctivae normal.     Pupils: Pupils are equal, round, and reactive to light.  Cardiovascular:     Rate and Rhythm: Normal rate and regular rhythm.  Pulmonary:     Effort: Pulmonary effort is normal. No respiratory distress.     Breath sounds: No wheezing.  Abdominal:     General: Bowel sounds are normal. There is no distension.     Palpations: Abdomen is soft.     Tenderness: There is no abdominal tenderness. There is no rebound.  Musculoskeletal:        General: No tenderness. Normal range of motion.  Lymphadenopathy:     Cervical: No cervical adenopathy.  Skin:    General: Skin is warm and dry.     Coloration: Skin is not pale.     Findings: No erythema or rash.  Neurological:     General: No focal deficit present.     Mental Status: She is alert and oriented to person, place, and time.     Motor: No abnormal muscle tone.     Coordination: Coordination normal.  Psychiatric:        Mood and Affect: Mood normal.        Behavior: Behavior normal.        Thought Content: Thought content normal.        Judgment: Judgment normal.     Left leg    CBC:    BMET Recent Labs    11/27/22 0508  NA 137  K 3.4*  CL 102  CO2 28  GLUCOSE 106*  BUN 10  CREATININE 0.79  CALCIUM 8.1*     Liver Panel  No results for input(s): "PROT", "ALBUMIN", "AST", "ALT", "ALKPHOS", "BILITOT", "BILIDIR", "IBILI" in the last 72 hours.     Sedimentation Rate No results for input(s): "ESRSEDRATE" in the last 72 hours. C-Reactive Protein No results for input(s): "CRP" in the last 72 hours.  Micro Results: Recent Results (from the past 720 hour(s))  SARS Coronavirus 2  by RT PCR (hospital order, performed in [facility] hospital lab) *cepheid single result test* Anterior Nasal Swab     Status: None   Collection Time: 11/21/22  6:09 PM   Specimen: Anterior Nasal Swab  Result Value Ref Range Status   SARS Coronavirus 2 by RT PCR NEGATIVE NEGATIVE Final    Comment: (NOTE) SARS-CoV-2 target nucleic acids are NOT DETECTED.  The SARS-CoV-2 RNA is generally detectable in upper and lower respiratory specimens during the acute phase of infection. The lowest concentration of SARS-CoV-2 viral copies this assay can detect is 250 copies / mL. A negative result does not preclude SARS-CoV-2 infection and should not be used as the sole basis for treatment or other patient management decisions.  A negative result may occur with improper specimen collection / handling, submission of specimen other than nasopharyngeal swab, presence of viral mutation(s) within the areas targeted by this assay, and inadequate number of viral copies (<250 copies / mL). A negative result must be combined with clinical observations, patient history, and epidemiological information.  Fact Sheet for Patients:   RoadLapTop.co.za  Fact Sheet for Healthcare Providers: http://kim-miller.com/  This test is not yet approved or  cleared by the [place] FDA and has been authorized for detection and/or diagnosis of SARS-CoV-2 by FDA under an Emergency Use Authorization (EUA).  This EUA will remain in effect (meaning this test can be used) for the duration of the COVID-19 declaration under Section 564(b)(1) of the Act, 21 U.S.C. section 360bbb-3(b)(1), unless the authorization is terminated or revoked sooner.  Performed at [facility], [address] [place], [place] [address]   Culture, blood (Routine x 2)     Status: None   Collection Time: 11/21/22  6:10 PM   Specimen: BLOOD  Result Value Ref Range Status   Specimen Description  BLOOD RIGHT ANTECUBITAL  Final   Special Requests   Final    BOTTLES DRAWN AEROBIC AND ANAEROBIC Blood Culture results may not be optimal due to an inadequate volume of blood received in culture bottles   Culture   Final    NO GROWTH 5 DAYS Performed at [facility], [address] [place], [place] [address]    Report Status 11/26/2022 FINAL  Final  Culture, blood (Routine x 2)     Status: None   Collection Time: 11/21/22  6:15 PM   Specimen: BLOOD  Result Value Ref Range Status   Specimen Description BLOOD BLOOD LEFT FOREARM  Final   Special Requests   Final    BOTTLES DRAWN AEROBIC AND ANAEROBIC Blood Culture adequate volume   Culture   Final    NO GROWTH 5 DAYS Performed at [facility], [address] [place], [place] [address]    Report Status 11/26/2022 FINAL  Final    Studies/Results: No results found.    Assessment/Plan:  INTERVAL HISTORY: Continues to improve   Principal Problem:   Sepsis due to cellulitis [facility]) Active Problems:   Cellulitis of left leg   Hypokalemia   Obesity (BMI 30-39.9)   AKI (acute kidney injury) (HCC)    Marissa Hayes is a 67 y.o. female with admission with high fevers myalgias and leukocytosis followed by erythema in her left lower extremity consistent with cellulitis which progressed.  She had had a prior hospitalization for cellulitis in the same leg in 2020.  She had blood cultures drawn which did  not grow any organism.  She was initially on quite broad-spectrum antibiotics with vancomycin and cefepime.  She was seen by my partner Dr. Drue Second and narrowed to cefazolin with the addition of Zyvox  He is subsequently improved and is eager to go home.  I think given the fact that this is not a purulent cellulitis we can get rid of the Zyvox though this may have been helpful antibiotics in terms of inhibiting toxin production.  Will plan on going home on cefadroxil to complete a total of 2 weeks of  antibiotics.  I have personally spent 50 minutes involved in face-to-face and non-face-to-face activities for this patient on the day of the visit. Professional time spent includes the following activities: Preparing to see the patient (review of tests), Obtaining and/or reviewing separately obtained history (admission/discharge record), Performing a medically appropriate examination and/or evaluation , Ordering medications/tests/procedures, referring and communicating with other health care professionals, Documenting clinical information in the EMR, Independently interpreting results (not separately reported), Communicating results to the patient/family/caregiver, Counseling and educating the patient/family/caregiver and Care coordination (not separately reported).     LOS: 8 days   Acey Lav 11/29/2022, 9:58 AM

## 2022-11-29 NOTE — Progress Notes (Signed)
Mobility Specialist - Progress Note   11/29/22 1126  Mobility  Activity Transferred to/from BSC;Dangled on edge of bed  Level of Assistance Standby assist, set-up cues, supervision of patient - no hands on  Assistive Device None  Distance Ambulated (ft) 6 ft  Activity Response Tolerated well  $Mobility charge 1 Mobility  Mobility Specialist Start Time (ACUTE ONLY) 1120  Mobility Specialist Stop Time (ACUTE ONLY) 1124  Mobility Specialist Time Calculation (min) (ACUTE ONLY) 4 min   Pt transferred to/from the [facility] SBA, tolerated well. Pt left seated EOB with needs within reach. RN notified.   Zetta Bills Mobility Specialist 11/29/22 11:28 AM

## 2022-11-29 NOTE — Plan of Care (Signed)
Resolve the plan of care, patient is adequate for discharge.  Marissa Hayes

## 2022-11-29 NOTE — Progress Notes (Signed)
Marissa Hayes Heft to be discharged Home per MD order. Discussed prescriptions and follow up appointments with the patient. Prescriptions given to patient, medication list explained in detail. Patient verbalized understanding.  Allergies as of 11/29/2022   No Known Allergies      Medication List     TAKE these medications    cefadroxil 500 MG capsule Commonly known as: DURICEF Take 2 capsules (1,000 mg total) by mouth 2 (two) times daily for 7 days.   docusate sodium 100 MG capsule Commonly known as: COLACE Take 1 capsule (100 mg total) by mouth 2 (two) times daily.   polyethylene glycol 17 g packet Commonly known as: MIRALAX / GLYCOLAX Take 17 g by mouth daily. Start taking on: November 30, 2022   potassium chloride SA 20 MEQ tablet Commonly known as: KLOR-CON M Take 2 tablets (40 mEq total) by mouth daily.   senna 8.6 MG Tabs tablet Commonly known as: SENOKOT Take 2 tablets (17.2 mg total) by mouth at bedtime.        Vitals:   11/29/22 0434 11/29/22 0738  BP: 117/62 (!) 120/59  Pulse: 64 68  Resp: 18 18  Temp: 98 F (36.7 C) 98.2 F (36.8 C)  SpO2: 98% 98%    Skin clean, dry and intact without evidence of skin break down and or skin tears. IV catheter discontinued intact. Site without signs and symptoms of complications. Dressing and pressure applied. Patient denies pain at this time. No complaints noted.  An After Visit Summary was printed and given to the patient. Patient escorted via wheelchair and discharged Home home via private auto.  Madie Reno, RN

## 2022-11-29 NOTE — Discharge Summary (Signed)
Physician Discharge Summary   Patient: Marissa Hayes MRN: [id number] DOB: 1955-06-09  Admit date:     11/21/2022  Discharge date: 11/29/22  Discharge Physician: Marcelino Duster   PCP: Pcp, No   Recommendations at discharge:   PCP follow up in 1 week. Finish 7 day antibiotic therapy for left leg cellulitis. BMP check for low potassium.  Discharge Diagnoses: Principal Problem:   Sepsis due to cellulitis [facility]) Active Problems:   Cellulitis of left leg   Hypokalemia   Obesity (BMI 30-39.9)   AKI (acute kidney injury) (HCC)  Resolved Problems:   * No resolved hospital problems. *  Hospital Course: Marissa Hayes is a 67 y.o. female with medical history significant of cellulitis of the left upper extremity in the past, morbid obesity who presented to the ER with fever, shortness of breath, generalized aches and chills. Patient was found to have a temperature 101 pulse was 102 and leukocytosis white count of 16.4.    She is admitted for sepsis secondary to left leg cellulitis started on broad spectrum antibiotics. She was given Vancomycin+Cefepime, later changed to Vanco and Zosyn. She remained febrile on and off. Left leg redness worsened. As clinical improvement not seen, ID consulted who recommended Cefazolin+Linezolid which is transitioned to [facility] 1gm bid for 7 days.  Patient's leg swelling, redness much improved.    Patient's potassium closely monitored, replaced accordingly.  She is hemodynamically stable to be discharged home.  Advised PCP follow-up upon discharge as instructed.  If her leg swelling, redness or pain worsens advised to come to the emergency department for further management.  Patient understands and agrees with the discharge plan.   Assessment and Plan: Sepsis secondary to cellulitis:  WBC improved, remained afebrile. Cefazolin and Linezolid transitioned to cefadroxil 1 g twice daily for 7 days per ID recommendations.  Blood cultures so far negative.    Cellulitis of left lower extremity:  Left swelling, redness improved today. Elevate the limb.   ABI lower extremities unremarkable. Uls left lower extremity negative for DVT. Continue Duricef 1 g twice daily for 7 days and follow-up with PCP.   Hypokalemia:  Daily oral supplements ordered. Advised PCP follow-up for BMP check next week.   AKI- Improved with IV hydration. Creatinine now at baseline.   Obesity Body mass index is 37.29 kg/m. Diet, exercise and weight reduction advised.        Consultants: ID Procedures performed: None Disposition: Home Diet recommendation:  Discharge Diet Orders (From admission, onward)     Start     Ordered   11/29/22 0000  Diet - low sodium heart healthy        11/29/22 1340           Cardiac diet DISCHARGE MEDICATION: Allergies as of 11/29/2022   No Known Allergies      Medication List     TAKE these medications    cefadroxil 500 MG capsule Commonly known as: DURICEF Take 2 capsules (1,000 mg total) by mouth 2 (two) times daily for 7 days.   docusate sodium 100 MG capsule Commonly known as: COLACE Take 1 capsule (100 mg total) by mouth 2 (two) times daily.   polyethylene glycol 17 g packet Commonly known as: MIRALAX / GLYCOLAX Take 17 g by mouth daily. Start taking on: November 30, 2022   potassium chloride SA 20 MEQ tablet Commonly known as: KLOR-CON M Take 2 tablets (40 mEq total) by mouth daily.   senna 8.6 MG Tabs tablet Commonly  known as: SENOKOT Take 2 tablets (17.2 mg total) by mouth at bedtime.        Discharge Exam: Filed Weights   11/21/22 1747 11/21/22 2339  Weight: 99.8 kg 108 kg   General -Elderly obese African American female, no distress noted. HEENT - PERRLA, EOMI, atraumatic head, non tender sinuses. Lung - clear, no rales, rhonchi. Heart - S1, S2 heard, no murmurs, rubs, trace pedal edema Neuro - Alert, awake and oriented x 3, non focal exam. Skin - Warm and dry. Left leg  swelling, redness improved.  Condition at discharge: stable  The results of significant diagnostics from this hospitalization (including imaging, microbiology, ancillary and laboratory) are listed below for reference.   Imaging Studies: US ARTERIAL ABI (SCREENING LOWER EXTREMITY)  Result Date: 11/26/2022 CLINICAL DATA:  Lower extremity edema EXAM: NONINVASIVE PHYSIOLOGIC VASCULAR STUDY OF BILATERAL LOWER EXTREMITIES TECHNIQUE: Evaluation of both lower extremities were performed at rest, including calculation of ankle-brachial indices with single level Doppler, pressure and pulse volume recording. COMPARISON:  None Available. FINDINGS: Right ABI:  1.1 Left ABI:  1.1 Right Lower Extremity:  Normal arterial waveforms at the ankle. Left Lower Extremity:  Normal arterial waveforms at the ankle. 1.0-1.4 Normal IMPRESSION: Normal resting ankle-brachial indices. Signed, Sterling Big, MD, RPVI [facility] Radiology Electronically Signed   By: Malachy Moan M.D.   On: 11/26/2022 08:08   US Venous Img Lower Unilateral Left (DVT)  Result Date: 11/22/2022 CLINICAL DATA:  Left lower extremity edema. EXAM: LEFT LOWER EXTREMITY VENOUS DOPPLER ULTRASOUND TECHNIQUE: Gray-scale sonography with graded compression, as well as color Doppler and duplex ultrasound were performed to evaluate the lower extremity deep venous systems from the level of the common femoral vein and including the common femoral, femoral, profunda femoral, popliteal and calf veins including the posterior tibial, peroneal and gastrocnemius veins when visible. The superficial great saphenous vein was also interrogated. Spectral Doppler was utilized to evaluate flow at rest and with distal augmentation maneuvers in the common femoral, femoral and popliteal veins. COMPARISON:  None Available. FINDINGS: Contralateral Common Femoral Vein: Respiratory phasicity is normal and symmetric with the  symptomatic side. No evidence of thrombus. Normal compressibility. Common Femoral Vein: No evidence of thrombus. Normal compressibility, respiratory phasicity and response to augmentation. Saphenofemoral Junction: No evidence of thrombus. Normal compressibility and flow on color Doppler imaging. Profunda Femoral Vein: No evidence of thrombus. Normal compressibility and flow on color Doppler imaging. Femoral Vein: No evidence of thrombus. Normal compressibility, respiratory phasicity and response to augmentation. Popliteal Vein: No evidence of thrombus. Normal compressibility, respiratory phasicity and response to augmentation. Calf Veins: No evidence of thrombus. Normal compressibility and flow on color Doppler imaging. Superficial Great Saphenous Vein: No evidence of thrombus. Normal compressibility. Venous Reflux:  None. Other Findings: No evidence of superficial thrombophlebitis or abnormal fluid collection. IMPRESSION: No evidence of left lower extremity deep venous thrombosis. Electronically Signed   By: Irish Lack M.D.   On: 11/22/2022 16:12   DG Chest Port 1 View  Result Date: 11/21/2022 CLINICAL DATA:  Sepsis.  Body aches.  Fever. EXAM: PORTABLE CHEST 1 VIEW COMPARISON:  11/09/2020 FINDINGS: Rotated exam. The heart is upper normal in size. Normal mediastinal contours allowing for rotation. No focal airspace disease. No pulmonary edema. No large pleural effusion or pneumothorax. No acute osseous findings. IMPRESSION: No acute chest findings.  Rotated exam. Electronically Signed   By: Narda Rutherford M.D.   On: 11/21/2022 20:01    Microbiology: Results  for orders placed or performed during the hospital encounter of 11/21/22  SARS Coronavirus 2 by RT PCR (hospital order, performed in [facility] hospital lab) *cepheid single result test* Anterior Nasal Swab     Status: None   Collection Time: 11/21/22  6:09 PM   Specimen: Anterior Nasal Swab  Result Value Ref Range Status   SARS Coronavirus 2  by RT PCR NEGATIVE NEGATIVE Final    Comment: (NOTE) SARS-CoV-2 target nucleic acids are NOT DETECTED.  The SARS-CoV-2 RNA is generally detectable in upper and lower respiratory specimens during the acute phase of infection. The lowest concentration of SARS-CoV-2 viral copies this assay can detect is 250 copies / mL. A negative result does not preclude SARS-CoV-2 infection and should not be used as the sole basis for treatment or other patient management decisions.  A negative result may occur with improper specimen collection / handling, submission of specimen other than nasopharyngeal swab, presence of viral mutation(s) within the areas targeted by this assay, and inadequate number of viral copies (<250 copies / mL). A negative result must be combined with clinical observations, patient history, and epidemiological information.  Fact Sheet for Patients:   RoadLapTop.co.za  Fact Sheet for Healthcare Providers: http://kim-miller.com/  This test is not yet approved or  cleared by the [place] FDA and has been authorized for detection and/or diagnosis of SARS-CoV-2 by FDA under an Emergency Use Authorization (EUA).  This EUA will remain in effect (meaning this test can be used) for the duration of the COVID-19 declaration under Section 564(b)(1) of the Act, 21 U.S.C. section 360bbb-3(b)(1), unless the authorization is terminated or revoked sooner.  Performed at [facility], [address] [place], [place] [address]   Culture, blood (Routine x 2)     Status: None   Collection Time: 11/21/22  6:10 PM   Specimen: BLOOD  Result Value Ref Range Status   Specimen Description BLOOD RIGHT ANTECUBITAL  Final   Special Requests   Final    BOTTLES DRAWN AEROBIC AND ANAEROBIC Blood Culture results may not be optimal due to an inadequate volume of blood received in culture bottles   Culture   Final    NO GROWTH 5 DAYS Performed  at [facility], [address] [place], [place] [address]    Report Status 11/26/2022 FINAL  Final  Culture, blood (Routine x 2)     Status: None   Collection Time: 11/21/22  6:15 PM   Specimen: BLOOD  Result Value Ref Range Status   Specimen Description BLOOD BLOOD LEFT FOREARM  Final   Special Requests   Final    BOTTLES DRAWN AEROBIC AND ANAEROBIC Blood Culture adequate volume   Culture   Final    NO GROWTH 5 DAYS Performed at [facility], [address] [place], [place] [address]    Report Status 11/26/2022 FINAL  Final    Labs: CBC: Recent Labs  Lab 11/23/22 0521 11/25/22 0511 11/26/22 0519 11/27/22 0508 11/28/22 0425  WBC 12.7* 14.8* 15.2* 14.1* 11.6*  NEUTROABS  --   --   --   --  7.9*  HGB 11.1* 10.8* 10.5* 10.4* 10.8*  HCT 32.9* 32.3* 31.2* 31.3* 33.0*  MCV 91.6 91.8 91.2 91.0 91.7  PLT 164 195 217 261 325   Basic Metabolic Panel: Recent Labs  Lab 11/23/22 0521 11/25/22 0511 11/26/22 0519 11/27/22 0508 11/29/22 1140  NA 136 136 136 137  --   K 3.5 3.4* 3.3* 3.4* 3.5  CL 105 103 104 102  --   CO2 23 26 26 28   --   GLUCOSE 109* 112* 109* 106*  --   BUN 16 10 7* 10  --   CREATININE 0.97 0.84 0.66 0.79  --   CALCIUM 8.1* 8.1* 8.2* 8.1*  --    Liver Function Tests: No results for input(s): "AST", "ALT", "ALKPHOS", "BILITOT", "PROT", "ALBUMIN" in the last 168 hours. CBG: No results for input(s): "GLUCAP" in the last 168 hours.  Discharge time spent: 38 minutes.  Signed: Marcelino Duster, MD [facility] 11/29/2022

## 2022-12-06 DIAGNOSIS — L03116 Cellulitis of left lower limb: Secondary | ICD-10-CM | POA: Diagnosis not present

## 2022-12-06 DIAGNOSIS — E876 Hypokalemia: Secondary | ICD-10-CM | POA: Diagnosis not present

## 2022-12-06 DIAGNOSIS — Z09 Encounter for follow-up examination after completed treatment for conditions other than malignant neoplasm: Secondary | ICD-10-CM | POA: Diagnosis not present

## 2022-12-19 ENCOUNTER — Ambulatory Visit: Payer: Self-pay | Admitting: *Deleted

## 2022-12-19 NOTE — Telephone Encounter (Signed)
  Chief Complaint: left leg swelling continues Symptoms: left leg swelling below knee. Some redness persist since 12/06/22 dx cellulitis. Skin darker in color. Some numbness back of left leg at ankle at times. Denies chest pain now but reluctantly reports she has had chest pain / discomfort that comes and goes in the past since leg swelling. Denies difficulty breathing or SOB.  Frequency: greater than a month. Pertinent Negatives: Patient denies chest pain no difficulty breathing no fever Disposition: [] ED /[x] Urgent Care (no appt availability in office) / [] Appointment(In office/virtual)/ []  [facility]/ [] Home Care/ [] Refused Recommended Disposition /[] [facility]/ []  Follow-up with PCP Additional Notes:    Patient has appt scheduled at [facility] tomorrow.  Patient requesting if she should wear compression stockings now. Recommended to review with MD tomorrow at [facility] Recommended to elevate left leg as needed and if pain increases, sx occur chest pain SOB go to ED. Patient verbalized understanding.    Summary: Leg swelling   Pt's granddaughter called reporting that the patient is experiencing leg swelling  Best contact: [phone]         Reason for Disposition  [1] MODERATE leg swelling (e.g., swelling extends up to knees) AND [2] new-onset or worsening  Answer Assessment - Initial Assessment Questions 1. ONSET: "When did the swelling start?" (e.g., minutes, hours, days)     More than a month 2. LOCATION: "What part of the leg is swollen?"  "Are both legs swollen or just one leg?"     Below knee left leg 3. SEVERITY: "How bad is the swelling?" (e.g., localized; mild, moderate, severe)   - Localized: Small area of swelling localized to one leg.   - MILD pedal edema: Swelling limited to foot and ankle, pitting edema < 1/4 inch (6 mm) deep, rest and elevation eliminate most or all swelling.   - MODERATE edema: Swelling of lower leg to knee,  pitting edema > 1/4 inch (6 mm) deep, rest and elevation only partially reduce swelling.   - SEVERE edema: Swelling extends above knee, facial or hand swelling present.      Below knee 4. REDNESS: "Does the swelling look red or infected?"     Less  5. PAIN: "Is the swelling painful to touch?" If Yes, ask: "How painful is it?"   (Scale 1-10; mild, moderate or severe)     Shooting sharp pain at times.  6. FEVER: "Do you have a fever?" If Yes, ask: "What is it, how was it measured, and when did it start?"      na 7. CAUSE: "What do you think is causing the leg swelling?"     Not sure  8. MEDICAL HISTORY: "Do you have a history of blood clots (e.g., DVT), cancer, heart failure, kidney disease, or liver failure?"     Na  9. RECURRENT SYMPTOM: "Have you had leg swelling before?" If Yes, ask: "When was the last time?" "What happened that time?"     Yes hx cellulitis 12/06/22 10. OTHER SYMPTOMS: "Do you have any other symptoms?" (e.g., chest pain, difficulty breathing)       Chest pain at times comes and goes. But denies now. No worsening pain or swelling now .  11. PREGNANCY: "Is there any chance you are pregnant?" "When was your last menstrual period?"       na  Protocols used: Leg Swelling and Edema-A-AH

## 2022-12-20 DIAGNOSIS — R7309 Other abnormal glucose: Secondary | ICD-10-CM | POA: Diagnosis not present

## 2022-12-20 DIAGNOSIS — G4700 Insomnia, unspecified: Secondary | ICD-10-CM | POA: Diagnosis not present

## 2022-12-20 DIAGNOSIS — M7989 Other specified soft tissue disorders: Secondary | ICD-10-CM | POA: Diagnosis not present

## 2022-12-20 DIAGNOSIS — Z8639 Personal history of other endocrine, nutritional and metabolic disease: Secondary | ICD-10-CM | POA: Diagnosis not present

## 2022-12-20 DIAGNOSIS — I872 Venous insufficiency (chronic) (peripheral): Secondary | ICD-10-CM | POA: Diagnosis not present

## 2022-12-20 DIAGNOSIS — Z13820 Encounter for screening for osteoporosis: Secondary | ICD-10-CM | POA: Diagnosis not present

## 2022-12-23 DIAGNOSIS — Z780 Asymptomatic menopausal state: Secondary | ICD-10-CM | POA: Diagnosis not present

## 2023-01-07 ENCOUNTER — Ambulatory Visit: Payer: Medicare Other | Admitting: Pediatrics

## 2023-03-28 DIAGNOSIS — F5101 Primary insomnia: Secondary | ICD-10-CM | POA: Diagnosis not present

## 2023-03-28 DIAGNOSIS — Z1231 Encounter for screening mammogram for malignant neoplasm of breast: Secondary | ICD-10-CM | POA: Diagnosis not present

## 2023-03-28 DIAGNOSIS — E7849 Other hyperlipidemia: Secondary | ICD-10-CM | POA: Diagnosis not present

## 2023-06-20 ENCOUNTER — Emergency Department

## 2023-06-20 ENCOUNTER — Inpatient Hospital Stay
Admission: EM | Admit: 2023-06-20 | Discharge: 2023-06-24 | DRG: 872 | Disposition: A | Discharge status: Home / Self Care | Attending: Family Medicine | Admitting: Family Medicine

## 2023-06-20 DIAGNOSIS — N3000 Acute cystitis without hematuria: Secondary | ICD-10-CM

## 2023-06-20 DIAGNOSIS — R61 Generalized hyperhidrosis: Secondary | ICD-10-CM | POA: Diagnosis not present

## 2023-06-20 DIAGNOSIS — G4700 Insomnia, unspecified: Secondary | ICD-10-CM | POA: Diagnosis present

## 2023-06-20 DIAGNOSIS — K5730 Diverticulosis of large intestine without perforation or abscess without bleeding: Secondary | ICD-10-CM | POA: Diagnosis present

## 2023-06-20 DIAGNOSIS — N390 Urinary tract infection, site not specified: Secondary | ICD-10-CM | POA: Diagnosis present

## 2023-06-20 DIAGNOSIS — K5909 Other constipation: Secondary | ICD-10-CM | POA: Insufficient documentation

## 2023-06-20 DIAGNOSIS — M160 Bilateral primary osteoarthritis of hip: Secondary | ICD-10-CM | POA: Diagnosis present

## 2023-06-20 DIAGNOSIS — L03116 Cellulitis of left lower limb: Principal | ICD-10-CM

## 2023-06-20 DIAGNOSIS — R590 Localized enlarged lymph nodes: Secondary | ICD-10-CM | POA: Diagnosis present

## 2023-06-20 DIAGNOSIS — K449 Diaphragmatic hernia without obstruction or gangrene: Secondary | ICD-10-CM | POA: Diagnosis not present

## 2023-06-20 DIAGNOSIS — A419 Sepsis, unspecified organism: Secondary | ICD-10-CM | POA: Diagnosis not present

## 2023-06-20 DIAGNOSIS — R9389 Abnormal findings on diagnostic imaging of other specified body structures: Secondary | ICD-10-CM

## 2023-06-20 DIAGNOSIS — Z79899 Other long term (current) drug therapy: Secondary | ICD-10-CM

## 2023-06-20 DIAGNOSIS — E785 Hyperlipidemia, unspecified: Secondary | ICD-10-CM | POA: Diagnosis not present

## 2023-06-20 DIAGNOSIS — K429 Umbilical hernia without obstruction or gangrene: Secondary | ICD-10-CM | POA: Diagnosis not present

## 2023-06-20 DIAGNOSIS — I878 Other specified disorders of veins: Secondary | ICD-10-CM | POA: Diagnosis not present

## 2023-06-20 DIAGNOSIS — M7989 Other specified soft tissue disorders: Secondary | ICD-10-CM | POA: Diagnosis not present

## 2023-06-20 LAB — URINALYSIS, ROUTINE W REFLEX MICROSCOPIC
Bilirubin Urine: NEGATIVE
Glucose, UA: NEGATIVE mg/dL
Hgb urine dipstick: NEGATIVE
Ketones, ur: NEGATIVE mg/dL
Nitrite: NEGATIVE
Protein, ur: 100 mg/dL — AB
Specific Gravity, Urine: 1.024 (ref 1.005–1.030)
pH: 6 (ref 5.0–8.0)

## 2023-06-20 LAB — BASIC METABOLIC PANEL WITH GFR
Anion gap: 12 (ref 5–15)
BUN: 16 mg/dL (ref 8–23)
CO2: 22 mmol/L (ref 22–32)
Calcium: 8.5 mg/dL — ABNORMAL LOW (ref 8.9–10.3)
Chloride: 102 mmol/L (ref 98–111)
Creatinine, Ser: 1.23 mg/dL — ABNORMAL HIGH (ref 0.44–1.00)
GFR, Estimated: 47 mL/min — ABNORMAL LOW (ref >60)
Glucose, Bld: 121 mg/dL — ABNORMAL HIGH (ref 70–99)
Potassium: 3.8 mmol/L (ref 3.5–5.1)
Sodium: 136 mmol/L (ref 135–145)

## 2023-06-20 LAB — CBC
HCT: 38.9 % (ref 36.0–46.0)
Hemoglobin: 13.2 g/dL (ref 12.0–15.0)
MCH: 30.3 pg (ref 26.0–34.0)
MCHC: 33.9 g/dL (ref 30.0–36.0)
MCV: 89.4 fL (ref 80.0–100.0)
Platelets: 185 10*3/uL (ref 150–400)
RBC: 4.35 MIL/uL (ref 3.87–5.11)
RDW: 13.6 % (ref 11.5–15.5)
WBC: 18.4 10*3/uL — ABNORMAL HIGH (ref 4.0–10.5)
nRBC: 0 % (ref 0.0–0.2)

## 2023-06-20 MED ORDER — SODIUM CHLORIDE 0.9 % IV BOLUS
1000.0000 mL | Freq: Once | INTRAVENOUS | Status: AC
  Administered 2023-06-20: 1000 mL via INTRAVENOUS

## 2023-06-20 MED ORDER — SODIUM CHLORIDE 0.9 % IV SOLN
2.0000 g | Freq: Once | INTRAVENOUS | Status: AC
  Administered 2023-06-20: 2 g via INTRAVENOUS
  Filled 2023-06-20: qty 20

## 2023-06-20 MED ORDER — ONDANSETRON 4 MG PO TBDP
4.0000 mg | ORAL_TABLET | Freq: Once | ORAL | Status: AC
  Administered 2023-06-20: 4 mg via ORAL
  Filled 2023-06-20: qty 1

## 2023-06-20 NOTE — ED Provider Notes (Signed)
 [facility] Provider Note    Event Date/Time   First MD Initiated Contact with Patient 06/20/23 2032     (approximate)   History   No chief complaint on file.   HPI  Marissa Hayes is a 68 year old female presenting to the emergency department for evaluation of fever.  Earlier today, patient had onset of chills, fever up to Tmax of 102 at home.  She additionally reports urinary urgency and was unable to make it to the bathroom earlier today, but was aware that she was using the bathroom.  When asked further, does note that she has also had some increased redness over her left leg that thing started a couple days ago.  No recent trauma.  Physical Exam   Triage Vital Signs: ED Triage Vitals  Encounter Vitals Group     BP 06/20/23 1923 (!) 115/54     Systolic BP Percentile --      Diastolic BP Percentile --      Pulse Rate 06/20/23 1923 71     Resp 06/20/23 1923 16     Temp 06/20/23 1923 98.7 F (37.1 C)     Temp Source 06/20/23 1923 Oral     SpO2 06/20/23 1923 96 %     Weight 06/20/23 1926 210 lb (95.3 kg)     Height 06/20/23 1926 5\' 6"  (1.676 m)     Head Circumference --      Peak Flow --      Pain Score 06/20/23 1926 0     Pain Loc --      Pain Education --      Exclude from Growth Chart --     Most recent vital signs: Vitals:   06/20/23 2058 06/20/23 2145  BP: (!) 95/47 (!) 104/58  Pulse: 69 70  Resp: 18   Temp: 98.7 F (37.1 C)   SpO2: 99% 97%     General: Awake, interactive  CV:  Regular rate, good peripheral perfusion.  Resp:  Unlabored respirations, lungs good auscultation Abd:  Nondistended, soft, nontender to palpation Neuro:  Symmetric facial movement, fluid speech MSK:  Swelling and erythema of the left lower extremity compared to the contralateral side.  2+ DP pulses.  Intact sensation.   ED Results / Procedures / Treatments   Labs (all labs ordered are listed, but only abnormal results are displayed) Labs Reviewed   URINALYSIS, ROUTINE W REFLEX MICROSCOPIC - Abnormal; Notable for the following components:      Result Value   Color, Urine AMBER (*)    APPearance CLOUDY (*)    Protein, ur 100 (*)    Leukocytes,Ua TRACE (*)    Bacteria, UA MANY (*)    All other components within normal limits  CBC - Abnormal; Notable for the following components:   WBC 18.4 (*)    All other components within normal limits  BASIC METABOLIC PANEL WITH GFR - Abnormal; Notable for the following components:   Glucose, Bld 121 (*)    Creatinine, Ser 1.23 (*)    Calcium 8.5 (*)    GFR, Estimated 47 (*)    All other components within normal limits  CULTURE, BLOOD (ROUTINE X 2)  CULTURE, BLOOD (ROUTINE X 2)  LACTIC ACID, PLASMA  LACTIC ACID, PLASMA  HEPATIC FUNCTION PANEL     EKG EKG independently reviewed interpreted by myself (ER attending) demonstrates:    RADIOLOGY Imaging independently reviewed and interpreted by myself demonstrates:  Lower ultrasound without evidence of  DVT CT abdomen pelvis without renal stone, does note thickened endometrium, likely incidental with recommendation for pelvic ultrasound  PROCEDURES:  Critical Care performed: No  Procedures   MEDICATIONS ORDERED IN ED: Medications  cefTRIAXone (ROCEPHIN) 2 g in sodium chloride 0.9 % 100 mL IVPB (2 g Intravenous New Bag/Given 06/20/23 2344)  ondansetron (ZOFRAN-ODT) disintegrating tablet 4 mg (4 mg Oral Given 06/20/23 2007)  sodium chloride 0.9 % bolus 1,000 mL (1,000 mLs Intravenous New Bag/Given 06/20/23 2314)     IMPRESSION / MDM / ASSESSMENT AND PLAN / ED COURSE  I reviewed the triage vital signs and the nursing notes.  Differential diagnosis includes, but is not limited to, cellulitis, UTI, renal stone, other acute intra-abdominal process  Patient's presentation is most consistent with acute presentation with potential threat to life or bodily function.  68 year old female presenting with fever, urinary urgency, also noted to  have leg swelling on exam.  Stable vitals on presentation.  Labs with leukocytosis WC of 18.4, BMP with mildly impaired renal function at 1.23.  Urine is concerning for infection with many bacteria, 11-20 white blood cells, trace leukocyte Estrace.  Culture sent.  CT notes thickened endometrium, ultrasound ordered to further evaluate.  Does not currently meet SIRS criteria, but is having fevers at home, so blood cultures and lactate ordered.  Ordered for empiric Rocephin.  With multiple sources of infection and fever, do think patient is appropriate for admission.  Will reach out to hospitalist team to discuss admission.  11:53 PM Case discussed with Dr. Arville Care.  He will evaluate for anticipated admission.      FINAL CLINICAL IMPRESSION(S) / ED DIAGNOSES   Final diagnoses:  Cellulitis of left lower extremity  Acute cystitis without hematuria     Rx / DC Orders   ED Discharge Orders     None        Note:  This document was prepared using Dragon voice recognition software and may include unintentional dictation errors.   Trinna Post, MD 06/20/23 [phone]

## 2023-06-20 NOTE — ED Triage Notes (Signed)
 Pt presents to ER from home with complaints of urine incontinence since today, chills, running a fever. Pt reports temperature at home was 102F after taking Aleve temperature went down to 98. Pt talks in complete sentences no respiratory distress noted

## 2023-06-21 ENCOUNTER — Encounter: Payer: Self-pay | Admitting: Family Medicine

## 2023-06-21 ENCOUNTER — Other Ambulatory Visit: Payer: Self-pay

## 2023-06-21 DIAGNOSIS — K5909 Other constipation: Secondary | ICD-10-CM | POA: Insufficient documentation

## 2023-06-21 DIAGNOSIS — L03116 Cellulitis of left lower limb: Secondary | ICD-10-CM

## 2023-06-21 DIAGNOSIS — R9389 Abnormal findings on diagnostic imaging of other specified body structures: Secondary | ICD-10-CM

## 2023-06-21 DIAGNOSIS — N390 Urinary tract infection, site not specified: Secondary | ICD-10-CM | POA: Diagnosis not present

## 2023-06-21 LAB — CBC
HCT: 39.8 % (ref 36.0–46.0)
Hemoglobin: 12.9 g/dL (ref 12.0–15.0)
MCH: 29.9 pg (ref 26.0–34.0)
MCHC: 32.4 g/dL (ref 30.0–36.0)
MCV: 92.1 fL (ref 80.0–100.0)
Platelets: 121 10*3/uL — ABNORMAL LOW (ref 150–400)
RBC: 4.32 MIL/uL (ref 3.87–5.11)
RDW: 13.8 % (ref 11.5–15.5)
WBC: 17 10*3/uL — ABNORMAL HIGH (ref 4.0–10.5)
nRBC: 0 % (ref 0.0–0.2)

## 2023-06-21 LAB — HEPATIC FUNCTION PANEL
ALT: 28 U/L (ref 0–44)
AST: 53 U/L — ABNORMAL HIGH (ref 15–41)
Albumin: 3.5 g/dL (ref 3.5–5.0)
Alkaline Phosphatase: 64 U/L (ref 38–126)
Bilirubin, Direct: 0.2 mg/dL (ref 0.0–0.2)
Indirect Bilirubin: 0.8 mg/dL (ref 0.3–0.9)
Total Bilirubin: 1 mg/dL (ref 0.0–1.2)
Total Protein: 7.4 g/dL (ref 6.5–8.1)

## 2023-06-21 LAB — BASIC METABOLIC PANEL WITH GFR
Anion gap: 10 (ref 5–15)
BUN: 18 mg/dL (ref 8–23)
CO2: 24 mmol/L (ref 22–32)
Calcium: 8.4 mg/dL — ABNORMAL LOW (ref 8.9–10.3)
Chloride: 103 mmol/L (ref 98–111)
Creatinine, Ser: 1.18 mg/dL — ABNORMAL HIGH (ref 0.44–1.00)
GFR, Estimated: 50 mL/min — ABNORMAL LOW (ref >60)
Glucose, Bld: 84 mg/dL (ref 70–99)
Potassium: 3.7 mmol/L (ref 3.5–5.1)
Sodium: 137 mmol/L (ref 135–145)

## 2023-06-21 LAB — LACTIC ACID, PLASMA
Lactic Acid, Venous: 2.2 mmol/L (ref 0.5–1.9)
Lactic Acid, Venous: 0.7 mmol/L (ref 0.5–1.9)

## 2023-06-21 MED ORDER — VANCOMYCIN HCL 2000 MG/400ML IV SOLN
2000.0000 mg | Freq: Once | INTRAVENOUS | Status: AC
  Administered 2023-06-21: 2000 mg via INTRAVENOUS
  Filled 2023-06-21 (×2): qty 400

## 2023-06-21 MED ORDER — ONDANSETRON HCL 4 MG/2ML IJ SOLN: 4.0000 mg | Freq: Four times a day (QID) | INTRAMUSCULAR | Status: DC | PRN

## 2023-06-21 MED ORDER — ONDANSETRON HCL 4 MG PO TABS
4.0000 mg | ORAL_TABLET | Freq: Four times a day (QID) | ORAL | Status: DC | PRN
  Administered 2023-06-21: 4 mg via ORAL
  Filled 2023-06-21: qty 1

## 2023-06-21 MED ORDER — ENOXAPARIN SODIUM 60 MG/0.6ML IJ SOSY
50.0000 mg | PREFILLED_SYRINGE | INTRAMUSCULAR | Status: DC
  Administered 2023-06-21 – 2023-06-24 (×4): 50 mg via SUBCUTANEOUS
  Filled 2023-06-21 (×4): qty 0.6

## 2023-06-21 MED ORDER — ACETAMINOPHEN 650 MG RE SUPP: 650.0000 mg | Freq: Four times a day (QID) | RECTAL | Status: DC | PRN

## 2023-06-21 MED ORDER — MAGNESIUM HYDROXIDE 400 MG/5ML PO SUSP: 30.0000 mL | Freq: Every day | ORAL | Status: DC | PRN

## 2023-06-21 MED ORDER — POLYETHYLENE GLYCOL 3350 17 G PO PACK
17.0000 g | PACK | Freq: Every day | ORAL | Status: DC
  Filled 2023-06-21 (×4): qty 1

## 2023-06-21 MED ORDER — CEFAZOLIN SODIUM-DEXTROSE 2-4 GM/100ML-% IV SOLN
2.0000 g | Freq: Three times a day (TID) | INTRAVENOUS | Status: DC
  Filled 2023-06-21 (×2): qty 100

## 2023-06-21 MED ORDER — SODIUM CHLORIDE 0.9 % IV SOLN
2.0000 g | INTRAVENOUS | Status: DC
  Administered 2023-06-21 – 2023-06-23 (×3): 2 g via INTRAVENOUS
  Filled 2023-06-21 (×3): qty 20

## 2023-06-21 MED ORDER — ACETAMINOPHEN 325 MG PO TABS
650.0000 mg | ORAL_TABLET | Freq: Four times a day (QID) | ORAL | Status: DC | PRN
  Administered 2023-06-21 – 2023-06-23 (×3): 650 mg via ORAL
  Filled 2023-06-21 (×3): qty 2

## 2023-06-21 MED ORDER — VANCOMYCIN HCL 1250 MG/250ML IV SOLN: 1250.0000 mg | INTRAVENOUS | Status: DC

## 2023-06-21 MED ORDER — SENNA 8.6 MG PO TABS: 2.0000 | ORAL_TABLET | Freq: Every day | ORAL | Status: DC

## 2023-06-21 MED ORDER — TRAZODONE HCL 50 MG PO TABS: 25.0000 mg | ORAL_TABLET | Freq: Every evening | ORAL | Status: DC | PRN

## 2023-06-21 MED ORDER — SODIUM CHLORIDE 0.9 % IV SOLN: INTRAVENOUS | Status: AC

## 2023-06-21 MED ORDER — POTASSIUM CHLORIDE CRYS ER 20 MEQ PO TBCR
40.0000 meq | EXTENDED_RELEASE_TABLET | Freq: Every day | ORAL | Status: DC
  Administered 2023-06-21 – 2023-06-24 (×4): 40 meq via ORAL
  Filled 2023-06-21 (×4): qty 2

## 2023-06-21 NOTE — ED Notes (Signed)
 Dr. Meriam Sprague at bedside

## 2023-06-21 NOTE — Progress Notes (Signed)
 Progress Note   Patient: Marissa Hayes ZOX:[id number] DOB: [birth year] DOA: 06/20/2023     1 DOS: the patient was seen and examined on 06/21/2023   Brief hospital course: Marissa Hayes is a 68 y.o. female with no significant medical history, presented to the emergency room with acute onset of fever associated chills a day before presentation.  She admits to urinary frequency and urgency without dysuria or hematuria or flank pain.  Her left leg has been swollen with erythema, warmth and tenderness.  Patient was therefore admitted for management of sepsis secondary to UTI as well as left lower extremity cellulitis.  Assessment and Plan:  Sepsis secondary to urinary tract infection as well as left lower extremity cellulitis Patient meets sepsis criteria with temperature 100.3, pulse 99, soft blood pressure 92/55 as well as leukocytosis 18.4 We will continue broad-spectrum antibiotics Follow-up on culture results  Left leg cellulitis Continue broad-spectrum antibiotic with vancomycin and Rocephin Continue as needed pain medication Patient instructed to continue with lower extremity elevation Doppler ultrasound did not show any DVT  Endometrial thickening on ultrasound - She will need GYN follow-up for endometrial sampling and biopsy.   Chronic constipation Continue MiraLAX, Senokot and docusate.     DVT prophylaxis: Lovenox.  Advanced Care Planning:  Code Status: full code.  Family Communication:  The plan of care was discussed in details with the patient (and family).  Disposition Plan: Back to previous home environment   Status is: Inpatient  Subjective:  Patient seen and examined at bedside this morning Still having swelling and significant erythema improving the left lower extremity She also complains of chills but no rigors, denies nausea vomiting abdominal pain or chest pain  Physical Exam: GENERAL:  68 y.o.-year-old female patient lying in the bed with no acute distress.   LUNGS: Normal breath sounds bilaterally, no wheezing, rales,rhonchi or crepitation. No use of accessory muscles of respiration.  CARDIOVASCULAR: Regular rate and rhythm, S1, S2 normal. No murmurs, rubs, or gallops.  ABDOMEN: Soft, nondistended, nontender. Bowel sounds present. No organomegaly or mass.  EXTREMITIES: Left leg erythema and swelling NEUROLOGIC: Cranial nerves II through XII are intact. Muscle strength 5/5 in all extremities. Sensation intact. Gait not checked.  PSYCHIATRIC: The patient is alert and oriented x 3.  Normal affect and good eye contact.   Vitals:   06/21/23 1000 06/21/23 1030 06/21/23 1100 06/21/23 1130  BP: (!) 98/52 111/71 (!) 107/56 (!) 97/49  Pulse: 99 94 96 93  Resp:    16  Temp:      TempSrc:      SpO2: 93% 94% 94% 96%  Weight:      Height:        Data Reviewed: CT scan of the abdomen report reviewed    Latest Ref Rng & Units 06/21/2023    7:10 AM 06/20/2023    7:34 PM 11/28/2022    4:25 AM  CBC  WBC 4.0 - 10.5 K/uL 17.0  18.4  11.6   Hemoglobin 12.0 - 15.0 g/dL 81.1  91.4  78.2   Hematocrit 36.0 - 46.0 % 39.8  38.9  33.0   Platelets 150 - 400 K/uL 121  185  325        Latest Ref Rng & Units 06/21/2023    7:10 AM 06/20/2023    7:34 PM 11/29/2022   11:40 AM  BMP  Glucose 70 - 99 mg/dL 84  956    BUN 8 - 23 mg/dL 18  16  Creatinine 0.44 - 1.00 mg/dL 1.61  0.96    Sodium 045 - 145 mmol/L 137  136    Potassium 3.5 - 5.1 mmol/L 3.7  3.8  3.5   Chloride 98 - 111 mmol/L 103  102    CO2 22 - 32 mmol/L 24  22    Calcium 8.9 - 10.3 mg/dL 8.4  8.5      Time spent: 56 minutes  Author: Loyce Dys, MD 06/21/2023 1:15 PM  For on call review [email or web address].

## 2023-06-21 NOTE — Assessment & Plan Note (Signed)
-   She will need GYN follow-up for endometrial sampling and biopsy.

## 2023-06-21 NOTE — Plan of Care (Signed)
  Problem: Clinical Measurements: Goal: Will remain free from infection Outcome: Progressing   Problem: Skin Integrity: Goal: Risk for impaired skin integrity will decrease Outcome: Progressing   

## 2023-06-21 NOTE — Assessment & Plan Note (Signed)
-   We will continue her MiraLAX, Senokot and docusate.

## 2023-06-21 NOTE — ED Notes (Signed)
 Lab contacted to draw repeat lac as pt has poor vein choices.

## 2023-06-21 NOTE — ED Notes (Signed)
 Lab contacted once again to request assistance with morning labs. Per Trula Ore in the lab pt is on their list.

## 2023-06-21 NOTE — H&P (Addendum)
 Marissa Hayes   PATIENT NAME: Marissa Hayes    MR#:  [id number]  DATE OF BIRTH:  1955/09/09  DATE OF ADMISSION:  06/20/2023  PRIMARY CARE PHYSICIAN: Pcp, No   Patient is coming from: Home  REQUESTING/REFERRING PHYSICIAN: Trinna Post, MD  CHIEF COMPLAINT:  Fever   HISTORY OF PRESENT ILLNESS:  Marissa Hayes is a 68 y.o. female with no significant medical history, presented to the emergency room with acute onset of fever associated chills yesterday.  She admits to urinary frequency and urgency without dysuria or hematuria or flank pain.  Marissa Hayes left leg has been swollen with erythema, warmth and tenderness.  No chest pain or palpitations.  No nausea or vomiting or abdominal pain. No dysuria, oliguria or hematuria or flank pain.  No cough or wheezing or dyspnea.  No other bleeding diathesis.  ED Course: When the patient came to the ER, BP was 115/54 with otherwise normal vital signs.  Labs revealed a creatinine of 1.23 with calcium of 8.5 with otherwise unremarkable CMP.  CBC showed leukocytosis of 18.4 and lactic acid was 2.2 with repeat level of 0.7.  UA came back positive for UTI.  Blood cultures were sent.  Urine culture was sent. EKG as reviewed by me : EKG showed normal sinus rhythm with a rate of 97. Imaging: Abdominal pelvic CT scan without contrast revealed the following: 1. No evidence for urinary tract calculus or obstruction. 2. Thickened heterogeneous endometrium, indeterminate. Recommend further evaluation with pelvic ultrasound. 3. Left iliac chain and inguinal lymphadenopathy of uncertain etiology.  Pelvic ultrasound revealed endometrial thickness of 32.1 mm there is considered abnormal for an asymptomatic postmenopausal female with recommendation for endometrial sampling.  Ovaries were not visualized.  Venous Doppler of the left lower extremity came back negative for DVT.  The patient was given 4 mg of IV Zofran, 1 L bolus of IV normal saline, 2 g of IV Rocephin.  She will be  admitted to a medical-surgical bed for further evaluation and management. PAST MEDICAL HISTORY:  No past medical history on file. No chronic medical problems. PAST SURGICAL HISTORY:   Past Surgical History:  Procedure Laterality Date   none    No previous surgeries.  SOCIAL HISTORY:   Social History   Tobacco Use   Smoking status: Never   Smokeless tobacco: Never  Substance Use Topics   Alcohol use: Never   6 tobacco EtOH abuse or illicit drug use.  FAMILY HISTORY:   Positive for hematologic cancer in Marissa Hayes sister.  DRUG ALLERGIES:  No Known Allergies  REVIEW OF SYSTEMS:   ROS As per history of present illness. All pertinent systems were reviewed above. Constitutional, HEENT, cardiovascular, respiratory, GI, GU, musculoskeletal, neuro, psychiatric, endocrine, integumentary and hematologic systems were reviewed and are otherwise negative/unremarkable except for positive findings mentioned above in the HPI.   MEDICATIONS AT HOME:   Prior to Admission medications   Medication Sig Start Date End Date Taking? Authorizing Provider  docusate sodium (COLACE) 100 MG capsule Take 1 capsule (100 mg total) by mouth 2 (two) times daily. 11/29/22   Marcelino Duster, MD  polyethylene glycol (MIRALAX / GLYCOLAX) 17 g packet Take 17 g by mouth daily. 11/30/22   Marcelino Duster, MD  potassium chloride SA (KLOR-CON M) 20 MEQ tablet Take 2 tablets (40 mEq total) by mouth daily. 11/29/22   Marcelino Duster, MD  senna (SENOKOT) 8.6 MG TABS tablet Take 2 tablets (17.2 mg total) by mouth at bedtime. 11/29/22  Marcelino Duster, MD      VITAL SIGNS:  Blood pressure (!) 121/103, pulse 72, temperature 98.3 F (36.8 C), resp. rate 16, height 5\' 6"  (1.676 m), weight 95.3 kg, SpO2 98%.  PHYSICAL EXAMINATION:  Physical Exam  GENERAL:  68 y.o.-year-old female patient lying in the bed with no acute distress.  EYES: Pupils equal, round, reactive to light and accommodation. No  scleral icterus. Extraocular muscles intact.  HEENT: Head atraumatic, normocephalic. Oropharynx and nasopharynx clear.  NECK:  Supple, no jugular venous distention. No thyroid enlargement, no tenderness.  LUNGS: Normal breath sounds bilaterally, no wheezing, rales,rhonchi or crepitation. No use of accessory muscles of respiration.  CARDIOVASCULAR: Regular rate and rhythm, S1, S2 normal. No murmurs, rubs, or gallops.  ABDOMEN: Soft, nondistended, nontender. Bowel sounds present. No organomegaly or mass.  EXTREMITIES: No pedal edema, cyanosis, or clubbing.  NEUROLOGIC: Cranial nerves II through XII are intact. Muscle strength 5/5 in all extremities. Sensation intact. Gait not checked.  PSYCHIATRIC: The patient is alert and oriented x 3.  Normal affect and good eye contact. SKIN: Left leg erythema with swelling, induration and tenderness with warmth.   LABORATORY PANEL:   CBC Recent Labs  Lab 06/20/23 1934  WBC 18.4*  HGB 13.2  HCT 38.9  PLT 185   ------------------------------------------------------------------------------------------------------------------  Chemistries  Recent Labs  Lab 06/20/23 1934  NA 136  K 3.8  CL 102  CO2 22  GLUCOSE 121*  BUN 16  CREATININE 1.23*  CALCIUM 8.5*  AST 53*  ALT 28  ALKPHOS 64  BILITOT 1.0   ------------------------------------------------------------------------------------------------------------------  Cardiac Enzymes No results for input(s): "TROPONINI" in the last 168 hours. ------------------------------------------------------------------------------------------------------------------  RADIOLOGY:  US Pelvis Complete Result Date: 06/21/2023 CLINICAL DATA:  Endometrial thickening on CT EXAM: TRANSABDOMINAL ULTRASOUND OF PELVIS TECHNIQUE: Transabdominal ultrasound examination of the pelvis was performed including evaluation of the uterus, ovaries, adnexal regions, and pelvic cul-de-sac. COMPARISON:  CT 06/20/2023 FINDINGS:  Uterus Measurements: 10.2 x 6.2 x 6.5 cm = volume: 217.5 mL. No fibroids or other mass visualized. Endometrium Thickness: 32.1.  No focal abnormality visualized. Right ovary Not seen Left ovary Not seen Other findings:  No abnormal free fluid. IMPRESSION: 1. Endometrial thickness of 32.1 mm. Endometrial thickness is considered abnormal for an asymptomatic post-menopausal female. Endometrial sampling should be considered to exclude carcinoma. 2. Nonvisualized ovaries Electronically Signed   By: Jasmine Pang M.D.   On: 06/21/2023 00:09   US Venous Img Lower  Left (DVT Study) Result Date: 06/20/2023 CLINICAL DATA:  Left lower extremity swelling and redness. EXAM: LEFT LOWER EXTREMITY VENOUS DOPPLER ULTRASOUND TECHNIQUE: Gray-scale sonography with graded compression, as well as color Doppler and duplex ultrasound were performed to evaluate the lower extremity deep venous systems from the level of the common femoral vein and including the common femoral, femoral, profunda femoral, popliteal and calf veins including the posterior tibial, peroneal and gastrocnemius veins when visible. The superficial great saphenous vein was also interrogated. Spectral Doppler was utilized to evaluate flow at rest and with distal augmentation maneuvers in the common femoral, femoral and popliteal veins. COMPARISON:  None Available. FINDINGS: Contralateral Common Femoral Vein: Respiratory phasicity is normal and symmetric with the symptomatic side. No evidence of thrombus. Normal compressibility. Common Femoral Vein: No evidence of thrombus. Normal compressibility, respiratory phasicity and response to augmentation. Saphenofemoral Junction: No evidence of thrombus. Normal compressibility and flow on color Doppler imaging. Profunda Femoral Vein: No evidence of thrombus. Normal compressibility and flow on color Doppler imaging. Femoral Vein: No  evidence of thrombus. Normal compressibility, respiratory phasicity and response to  augmentation. Popliteal Vein: No evidence of thrombus. Normal compressibility, respiratory phasicity and response to augmentation. Calf Veins: No evidence of thrombus. Normal compressibility and flow on color Doppler imaging. Superficial Great Saphenous Vein: No evidence of thrombus. Normal compressibility. Venous Reflux:  None. Other Findings:  None. IMPRESSION: No evidence of deep venous thrombosis. Electronically Signed   By: Aram Candela M.D.   On: 06/20/2023 23:01   CT ABDOMEN PELVIS WO CONTRAST Result Date: 06/20/2023 CLINICAL DATA:  Fever and chills. Acute abdominal pain. Urinary incontinence. EXAM: CT ABDOMEN AND PELVIS WITHOUT CONTRAST TECHNIQUE: Multidetector CT imaging of the abdomen and pelvis was performed following the standard protocol without IV contrast. RADIATION DOSE REDUCTION: This exam was performed according to the departmental dose-optimization program which includes automated exposure control, adjustment of the mA and/or kV according to patient size and/or use of iterative reconstruction technique. COMPARISON:  None FINDINGS: Lower chest: No acute abnormality. Hepatobiliary: No focal liver abnormality is seen. No gallstones, gallbladder wall thickening, or biliary dilatation. Pancreas: Unremarkable. No pancreatic ductal dilatation or surrounding inflammatory changes. Spleen: Normal in size without focal abnormality. Adrenals/Urinary Tract: Adrenal glands are unremarkable. Kidneys are normal, without renal calculi, focal lesion, or hydronephrosis. Bladder is unremarkable. Stomach/Bowel: There is a small hiatal hernia. Stomach is within normal limits. Appendix appears normal. No evidence of bowel wall thickening, distention, or inflammatory changes. There is sigmoid and descending colon diverticulosis. Vascular/Lymphatic: Aorta and IVC are normal in size. There are nonenlarged and enlarged left inguinal lymph nodes measuring up to 14 mm with surrounding inflammatory stranding. There are  numerous prominent left external iliac chain lymph nodes. There is 1 mildly enlarged left external iliac chain lymph node measuring 10 mm. Reproductive: The endometrial cavity appears heterogeneous and thickened measuring up to 2.2 cm. Bilateral ovaries are within normal limits. Other: There is no ascites. There is a small fat containing umbilical hernia. 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 IMPRESSION: 1. No evidence for urinary tract calculus or obstruction. 2. Thickened heterogeneous endometrium, indeterminate. Recommend further evaluation with pelvic ultrasound. 3. Left iliac chain and inguinal lymphadenopathy of uncertain etiology. The Electronically Signed   By: Darliss Cheney M.D.   On: 06/20/2023 22:19      IMPRESSION AND PLAN:  Assessment and Plan: * Acute lower UTI - The patient be admitted to a medical-surgical bed. - Will continue antibiotic therapy with IV Rocephin. - Will follow blood and urine cultures.  Left leg cellulitis - This should be covered with IV Rocephin. - Warm compresses will be applied. - Pain management will be provided.  Endometrial thickening on ultrasound - She will need GYN follow-up for endometrial sampling and biopsy.  Chronic constipation - We will continue Marissa Hayes MiraLAX, Senokot and docusate.   DVT prophylaxis: Lovenox. Advanced Care Planning:  Code Status: full code. Family Communication:  The plan of care was discussed in details with the patient (and family). I answered all questions. The patient agreed to proceed with the above mentioned plan. Further management will depend upon hospital course. Disposition Plan: Back to previous home environment Consults called: none. All the records are reviewed and case discussed with ED provider.  Status is: Inpatient  At the time of the admission, it appears that the appropriate admission status for this patient is inpatient.  This is judged to be reasonable and necessary in  order to provide the required intensity of service to ensure the patient's safety given  the presenting symptoms, physical exam findings and initial radiographic and laboratory data in the context of comorbid conditions.  The patient requires inpatient status due to high intensity of service, high risk of further deterioration and high frequency of surveillance required.  I certify that at the time of admission, it is my clinical judgment that the patient will require inpatient hospital care extending more than 2 midnights.                            Dispo: The patient is from: Home              Anticipated d/c is to: Home              Patient currently is not medically stable to d/c.              Difficult to place patient: No  Hannah Beat M.D on 06/21/2023 at 5:43 AM  [facility]   From 7 PM-7 AM, contact night-coverage [email or web address]  CC: Primary care physician; Pcp, No

## 2023-06-21 NOTE — Assessment & Plan Note (Signed)
-   This should be covered with IV Rocephin. - Warm compresses will be applied. - Pain management will be provided.

## 2023-06-21 NOTE — Consult Note (Addendum)
 Pharmacy Antibiotic Note  Marissa Hayes is a 68 y.o. female admitted on 06/20/2023 with sepsis secondary to UTI and LLE cellulitis. Presented with c/o urine incontinence. UA positive for UTI , started on Rocephin. She has no significant PMH. Left leg was swollen with erythema, warmth, and tenderness. Scr 1.18, plts 121, WBC 18.4 > 17. Pharmacy has been consulted for vancomycin dosing.  Plan: Vancomycin IV loading dose 2000 mg, followed by vancomycin 1250mg  IV every 24 hours. Goal AUC 400-600. Calculated AUC 469, Cmin 12.5.  Continue ceftriaxone 2 g q24h for UTI Monitor renal function and adjust vancomycin dose as clinically indicated  F/u cultures and de-escalate antibiotics   Height: 5\' 6"  (167.6 cm) Weight: 95.3 kg (210 lb) IBW/kg (Calculated) : 59.3  Temp (24hrs), Avg:99 F (37.2 C), Min:98.3 F (36.8 C), Max:100.3 F (37.9 C)  Recent Labs  Lab 06/20/23 1934 06/21/23 0031 06/21/23 0355 06/21/23 0710  WBC 18.4*  --   --  17.0*  CREATININE 1.23*  --   --  1.18*  LATICACIDVEN  --  2.2* 0.7  --     Estimated Creatinine Clearance: 51.6 mL/min (A) (by C-G formula based on SCr of 1.18 mg/dL (H)).    No Known Allergies  Antimicrobials this admission: 4/4 ceftriaxone >>  4/5 vancomycin >>   Microbiology results: 4/4 BCx: pending 4/4 UCx: pending   Thank you for allowing pharmacy to be a part of this patient's care.  Karlton Lemon, PharmD Candidate 06/21/2023 1:29 PM

## 2023-06-21 NOTE — Assessment & Plan Note (Signed)
-   The patient be admitted to a medical-surgical bed. - Will continue antibiotic therapy with IV Rocephin. - Will follow blood and urine cultures.

## 2023-06-21 NOTE — Progress Notes (Signed)
 PHARMACIST - PHYSICIAN COMMUNICATION  CONCERNING:  Enoxaparin (Lovenox) for DVT Prophylaxis    RECOMMENDATION: Patient was prescribed enoxaprin 40mg  q24 hours for VTE prophylaxis.   Filed Weights   06/20/23 1926  Weight: 95.3 kg (210 lb)    Body mass index is 33.89 kg/m.  Estimated Creatinine Clearance: 49.5 mL/min (A) (by C-G formula based on SCr of 1.23 mg/dL (H)).   Based on [facility] policy patient is candidate for enoxaparin 0.5mg /kg TBW SQ every 24 hours based on BMI being >30.  DESCRIPTION: Pharmacy has adjusted enoxaparin dose per [facility] policy.  Patient is now receiving enoxaparin 0.5 mg/kg every 24 hours   Otelia Sergeant, PharmD, [facility] 06/21/2023 1:40 AM

## 2023-06-22 DIAGNOSIS — R9389 Abnormal findings on diagnostic imaging of other specified body structures: Secondary | ICD-10-CM | POA: Diagnosis not present

## 2023-06-22 DIAGNOSIS — N390 Urinary tract infection, site not specified: Secondary | ICD-10-CM | POA: Diagnosis not present

## 2023-06-22 DIAGNOSIS — K5909 Other constipation: Secondary | ICD-10-CM

## 2023-06-22 DIAGNOSIS — L03116 Cellulitis of left lower limb: Secondary | ICD-10-CM

## 2023-06-22 LAB — CBC
HCT: 36.5 % (ref 36.0–46.0)
Hemoglobin: 12 g/dL (ref 12.0–15.0)
MCH: 30.1 pg (ref 26.0–34.0)
MCHC: 32.9 g/dL (ref 30.0–36.0)
MCV: 91.5 fL (ref 80.0–100.0)
Platelets: 163 10*3/uL (ref 150–400)
RBC: 3.99 MIL/uL (ref 3.87–5.11)
RDW: 13.9 % (ref 11.5–15.5)
WBC: 13.9 10*3/uL — ABNORMAL HIGH (ref 4.0–10.5)
nRBC: 0 % (ref 0.0–0.2)

## 2023-06-22 LAB — BASIC METABOLIC PANEL WITH GFR
Anion gap: 6 (ref 5–15)
BUN: 12 mg/dL (ref 8–23)
CO2: 22 mmol/L (ref 22–32)
Calcium: 8.2 mg/dL — ABNORMAL LOW (ref 8.9–10.3)
Chloride: 108 mmol/L (ref 98–111)
Creatinine, Ser: 1.09 mg/dL — ABNORMAL HIGH (ref 0.44–1.00)
GFR, Estimated: 55 mL/min — ABNORMAL LOW (ref >60)
Glucose, Bld: 96 mg/dL (ref 70–99)
Potassium: 4 mmol/L (ref 3.5–5.1)
Sodium: 136 mmol/L (ref 135–145)

## 2023-06-22 LAB — URINE CULTURE

## 2023-06-22 NOTE — Plan of Care (Signed)

## 2023-06-22 NOTE — Progress Notes (Signed)
 PROGRESS NOTE    Marissa DIPALMA  Hayes:[id number] DOB: [birth year] DOA: 06/20/2023 PCP: Pcp, No  No chief complaint on file.   Hospital Course:  Marissa Hayes is 68 y.o. female with osteoarthritis, hyperlipidemia, insomnia, who presented to the ED with acute onset fever, urinary frequency and urgency, left leg swelling and erythema.  Patient was admitted for management of sepsis secondary to UTI and left lower extremity cellulitis  Subjective: Fever overnight to 100.6, patient reports her leg pain is improving some.  Not completely resolved.   Objective: Vitals:   06/21/23 2134 06/22/23 0050 06/22/23 0317 06/22/23 0817  BP: (!) 153/64  (!) 116/52 131/62  Pulse: 86  79 77  Resp: 20  20 19   Temp: (!) 100.6 F (38.1 C) 98.7 F (37.1 C) 98.2 F (36.8 C) 98.7 F (37.1 C)  TempSrc: Oral Oral Oral   SpO2: 97%  98% 100%  Weight:      Height:        Intake/Output Summary (Last 24 hours) at 06/22/2023 0845 Last data filed at 06/22/2023 0003 Gross per 24 hour  Intake 1448.55 ml  Output --  Net 1448.55 ml   Filed Weights   06/20/23 1926  Weight: 95.3 kg    Examination: General exam: Appears calm and comfortable, NAD  Respiratory system: No work of breathing, symmetric chest wall expansion Cardiovascular system: S1 & S2 heard, RRR.  Gastrointestinal system: Abdomen is nondistended, soft and nontender.  Neuro: Alert and oriented. No focal neurological deficits. Extremities: Lower extremity very tender to palpation, erythematous up to mid calf. Skin: No rashes, lesions Psychiatry: Demonstrates appropriate judgement and insight. Mood & affect appropriate for situation.     Assessment & Plan:  Principal Problem:   Acute lower UTI Active Problems:   Left leg cellulitis   Endometrial thickening on ultrasound   Chronic constipation   Sepsis  -- Secondary to left lower extremity cellulitis - Criteria met on arrival with fever, leukocytosis, hypotension - On broad-spectrum  antibiotics - Continue to trend CBC, leukocytosis downtrending - Blood cultures negative to date, continue to follow  UTI - Follow urine culture results, ceftriaxone for now - CT abdomen pelvis negative for urinary tract findings.  Left leg cellulitis - Was initiated on vancomycin and Rocephin.  Will de-escalate vancomycin now. - Of note patient has had prior hospitalization for left leg cellulitis in September 2024.  During that time she required cefazolin and linezolid for propria source control but eventually was transition to [employer]. - Continue with as needed pain medication - Doppler negative for DVT - Continue lower extremity elevation  Endometrial thickening on ultrasound - Endometrial thickness of 32.1 mm. - Inguinal lymph node enlargement incidentally seen on CT - Needs outpatient follow-up with gynecology for biopsy.  Will refer at discharge and discussed directly with gynecology  Chronic constipation - Continue with bowel regimen  Small hiatal hernia - Incidentally seen on CT - As needed Pepcid  Sigmoid and descending colon diverticulosis - Continue bowel regimen  Left inguinal lymph node enlargement - Some nodes measuring 14 mm with surrounding inflammatory stranding as well as numerous prominent left external iliac chain lymph nodes  Osteoarthritis Severe degenerative changes of both hips seen on CT. - PT/OT - Pain management as needed  Hyperlipidemia - Patient currently working on diet control - Follow-up with PCP  DVT prophylaxis: lovenox   Code Status: Full Code Family Communication:  Discussed directly with patient Disposition: Inpatient, still hospitalized for IV antibiotics, pending source control.  Will discharge to home possibly with home health when infection is controlled Consultants:    Procedures:    Antimicrobials:  Anti-infectives (From admission, onward)    Start     Dose/Rate Route Frequency Ordered Stop   06/22/23 1600  vancomycin  (VANCOREADY) IVPB 1250 mg/250 mL        1,250 mg 166.7 mL/hr over 90 Minutes Intravenous Every 24 hours 06/21/23 1425     06/21/23 2200  cefTRIAXone (ROCEPHIN) 2 g in sodium chloride 0.9 % 100 mL IVPB        2 g 200 mL/hr over 30 Minutes Intravenous Every 24 hours 06/21/23 0539     06/21/23 1415  vancomycin (VANCOREADY) IVPB 2000 mg/400 mL        2,000 mg 200 mL/hr over 120 Minutes Intravenous  Once 06/21/23 1403 06/21/23 1839   06/21/23 0600  ceFAZolin (ANCEF) IVPB 2g/100 mL premix  Status:  Discontinued        2 g 200 mL/hr over 30 Minutes Intravenous Every 8 hours 06/21/23 0139 06/21/23 0539   06/20/23 2345  cefTRIAXone (ROCEPHIN) 2 g in sodium chloride 0.9 % 100 mL IVPB        2 g 200 mL/hr over 30 Minutes Intravenous Once 06/20/23 2330 06/21/23 0033       Data Reviewed: I have personally reviewed following labs and imaging studies CBC: Recent Labs  Lab 06/20/23 1934 06/21/23 0710 06/22/23 0459  WBC 18.4* 17.0* 13.9*  HGB 13.2 12.9 12.0  HCT 38.9 39.8 36.5  MCV 89.4 92.1 91.5  PLT 185 121* 163   Basic Metabolic Panel: Recent Labs  Lab 06/20/23 1934 06/21/23 0710 06/22/23 0459  NA 136 137 136  K 3.8 3.7 4.0  CL 102 103 108  CO2 22 24 22   GLUCOSE 121* 84 96  BUN 16 18 12   CREATININE 1.23* 1.18* 1.09*  CALCIUM 8.5* 8.4* 8.2*   GFR: Estimated Creatinine Clearance: 55.9 mL/min (A) (by C-G formula based on SCr of 1.09 mg/dL (H)). Liver Function Tests: Recent Labs  Lab 06/20/23 1934  AST 53*  ALT 28  ALKPHOS 64  BILITOT 1.0  PROT 7.4  ALBUMIN 3.5   CBG: No results for input(s): "GLUCAP" in the last 168 hours.  Recent Results (from the past 240 hours)  Blood Culture (routine x 2)     Status: None (Preliminary result)   Collection Time: 06/20/23 11:16 PM   Specimen: BLOOD  Result Value Ref Range Status   Specimen Description BLOOD RIGHT ASSIST CONTROL  Final   Special Requests   Final    BOTTLES DRAWN AEROBIC AND ANAEROBIC Blood Culture results may not  be optimal due to an inadequate volume of blood received in culture bottles   Culture   Final    NO GROWTH 1 DAY Performed at [facility], [address] [place], [place] [address]    Report Status PENDING  Incomplete  Blood Culture (routine x 2)     Status: None (Preliminary result)   Collection Time: 06/21/23 12:31 AM   Specimen: BLOOD  Result Value Ref Range Status   Specimen Description BLOOD BLOOD RIGHT HAND  Final   Special Requests   Final    BOTTLES DRAWN AEROBIC ONLY Blood Culture results may not be optimal due to an inadequate volume of blood received in culture bottles   Culture   Final    NO GROWTH 1 DAY Performed at [facility], [address], [place], [place] [address]  Report Status PENDING  Incomplete     Radiology Studies: US Pelvis Complete Result Date: 06/21/2023 CLINICAL DATA:  Endometrial thickening on CT EXAM: TRANSABDOMINAL ULTRASOUND OF PELVIS TECHNIQUE: Transabdominal ultrasound examination of the pelvis was performed including evaluation of the uterus, ovaries, adnexal regions, and pelvic cul-de-sac. COMPARISON:  CT 06/20/2023 FINDINGS: Uterus Measurements: 10.2 x 6.2 x 6.5 cm = volume: 217.5 mL. No fibroids or other mass visualized. Endometrium Thickness: 32.1.  No focal abnormality visualized. Right ovary Not seen Left ovary Not seen Other findings:  No abnormal free fluid. IMPRESSION: 1. Endometrial thickness of 32.1 mm. Endometrial thickness is considered abnormal for an asymptomatic post-menopausal female. Endometrial sampling should be considered to exclude carcinoma. 2. Nonvisualized ovaries Electronically Signed   By: Jasmine Pang M.D.   On: 06/21/2023 00:09   US Venous Img Lower  Left (DVT Study) Result Date: 06/20/2023 CLINICAL DATA:  Left lower extremity swelling and redness. EXAM: LEFT LOWER EXTREMITY VENOUS DOPPLER ULTRASOUND TECHNIQUE: Gray-scale sonography with graded compression, as well as color Doppler and duplex ultrasound  were performed to evaluate the lower extremity deep venous systems from the level of the common femoral vein and including the common femoral, femoral, profunda femoral, popliteal and calf veins including the posterior tibial, peroneal and gastrocnemius veins when visible. The superficial great saphenous vein was also interrogated. Spectral Doppler was utilized to evaluate flow at rest and with distal augmentation maneuvers in the common femoral, femoral and popliteal veins. COMPARISON:  None Available. FINDINGS: Contralateral Common Femoral Vein: Respiratory phasicity is normal and symmetric with the symptomatic side. No evidence of thrombus. Normal compressibility. Common Femoral Vein: No evidence of thrombus. Normal compressibility, respiratory phasicity and response to augmentation. Saphenofemoral Junction: No evidence of thrombus. Normal compressibility and flow on color Doppler imaging. Profunda Femoral Vein: No evidence of thrombus. Normal compressibility and flow on color Doppler imaging. Femoral Vein: No evidence of thrombus. Normal compressibility, respiratory phasicity and response to augmentation. Popliteal Vein: No evidence of thrombus. Normal compressibility, respiratory phasicity and response to augmentation. Calf Veins: No evidence of thrombus. Normal compressibility and flow on color Doppler imaging. Superficial Great Saphenous Vein: No evidence of thrombus. Normal compressibility. Venous Reflux:  None. Other Findings:  None. IMPRESSION: No evidence of deep venous thrombosis. Electronically Signed   By: Aram Candela M.D.   On: 06/20/2023 23:01   CT ABDOMEN PELVIS WO CONTRAST Result Date: 06/20/2023 CLINICAL DATA:  Fever and chills. Acute abdominal pain. Urinary incontinence. EXAM: CT ABDOMEN AND PELVIS WITHOUT CONTRAST TECHNIQUE: Multidetector CT imaging of the abdomen and pelvis was performed following the standard protocol without IV contrast. RADIATION DOSE REDUCTION: This exam was  performed according to the departmental dose-optimization program which includes automated exposure control, adjustment of the mA and/or kV according to patient size and/or use of iterative reconstruction technique. COMPARISON:  None FINDINGS: Lower chest: No acute abnormality. Hepatobiliary: No focal liver abnormality is seen. No gallstones, gallbladder wall thickening, or biliary dilatation. Pancreas: Unremarkable. No pancreatic ductal dilatation or surrounding inflammatory changes. Spleen: Normal in size without focal abnormality. Adrenals/Urinary Tract: Adrenal glands are unremarkable. Kidneys are normal, without renal calculi, focal lesion, or hydronephrosis. Bladder is unremarkable. Stomach/Bowel: There is a small hiatal hernia. Stomach is within normal limits. Appendix appears normal. No evidence of bowel wall thickening, distention, or inflammatory changes. There is sigmoid and descending colon diverticulosis. Vascular/Lymphatic: Aorta and IVC are normal in size. There are nonenlarged and enlarged left inguinal lymph nodes measuring up to 14 mm with surrounding inflammatory stranding.  There are numerous prominent left external iliac chain lymph nodes. There is 1 mildly enlarged left external iliac chain lymph node measuring 10 mm. Reproductive: The endometrial cavity appears heterogeneous and thickened measuring up to 2.2 cm. Bilateral ovaries are within normal limits. Other: There is no ascites. There is a small fat containing umbilical hernia. 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 IMPRESSION: 1. No evidence for urinary tract calculus or obstruction. 2. Thickened heterogeneous endometrium, indeterminate. Recommend further evaluation with pelvic ultrasound. 3. Left iliac chain and inguinal lymphadenopathy of uncertain etiology. The Electronically Signed   By: Darliss Cheney M.D.   On: 06/20/2023 22:19    Scheduled Meds:  enoxaparin (LOVENOX) injection  50 mg Subcutaneous  Q24H   polyethylene glycol  17 g Oral Daily   potassium chloride SA  40 mEq Oral Daily   Continuous Infusions:  cefTRIAXone (ROCEPHIN)  IV Stopped (06/21/23 2239)   vancomycin       LOS: 2 days  MDM: Patient is high risk for one or more organ failure.  They necessitate ongoing hospitalization for continued IV therapies and subsequent lab monitoring. Total time spent interpreting labs and vitals, coordinating care amongst consultants and care team members, directly assessing and discussing care with the patient and/or family: 55 min    Debarah Crape, DO [facility]  To contact the attending physician between 7A-7P please use Epic Chat. To contact the covering physician during after hours 7P-7A, please review Amion.   06/22/2023, 8:45 AM   *This document has been created with the assistance of dictation software. Please excuse typographical errors. *

## 2023-06-23 DIAGNOSIS — L03116 Cellulitis of left lower limb: Secondary | ICD-10-CM | POA: Diagnosis not present

## 2023-06-23 DIAGNOSIS — K5909 Other constipation: Secondary | ICD-10-CM | POA: Diagnosis not present

## 2023-06-23 DIAGNOSIS — N390 Urinary tract infection, site not specified: Secondary | ICD-10-CM | POA: Diagnosis not present

## 2023-06-23 DIAGNOSIS — R9389 Abnormal findings on diagnostic imaging of other specified body structures: Secondary | ICD-10-CM | POA: Diagnosis not present

## 2023-06-23 LAB — CBC WITH DIFFERENTIAL/PLATELET
Abs Immature Granulocytes: 0.1 10*3/uL — ABNORMAL HIGH (ref 0.00–0.07)
Basophils Absolute: 0 10*3/uL (ref 0.0–0.1)
Basophils Relative: 0 %
Eosinophils Absolute: 0.1 10*3/uL (ref 0.0–0.5)
Eosinophils Relative: 1 %
HCT: 32.5 % — ABNORMAL LOW (ref 36.0–46.0)
Hemoglobin: 10.9 g/dL — ABNORMAL LOW (ref 12.0–15.0)
Immature Granulocytes: 1 %
Lymphocytes Relative: 12 %
Lymphs Abs: 1.4 10*3/uL (ref 0.7–4.0)
MCH: 29.9 pg (ref 26.0–34.0)
MCHC: 33.5 g/dL (ref 30.0–36.0)
MCV: 89 fL (ref 80.0–100.0)
Monocytes Absolute: 1 10*3/uL (ref 0.1–1.0)
Monocytes Relative: 9 %
Neutro Abs: 9.2 10*3/uL — ABNORMAL HIGH (ref 1.7–7.7)
Neutrophils Relative %: 77 %
Platelets: 177 10*3/uL (ref 150–400)
RBC: 3.65 MIL/uL — ABNORMAL LOW (ref 3.87–5.11)
RDW: 13.7 % (ref 11.5–15.5)
WBC: 11.8 10*3/uL — ABNORMAL HIGH (ref 4.0–10.5)
nRBC: 0 % (ref 0.0–0.2)

## 2023-06-23 LAB — COMPREHENSIVE METABOLIC PANEL WITH GFR
ALT: 23 U/L (ref 0–44)
AST: 35 U/L (ref 15–41)
Albumin: 2.5 g/dL — ABNORMAL LOW (ref 3.5–5.0)
Alkaline Phosphatase: 87 U/L (ref 38–126)
Anion gap: 7 (ref 5–15)
BUN: 11 mg/dL (ref 8–23)
CO2: 24 mmol/L (ref 22–32)
Calcium: 8.2 mg/dL — ABNORMAL LOW (ref 8.9–10.3)
Chloride: 106 mmol/L (ref 98–111)
Creatinine, Ser: 0.93 mg/dL (ref 0.44–1.00)
GFR, Estimated: >60 mL/min (ref >60)
Glucose, Bld: 108 mg/dL — ABNORMAL HIGH (ref 70–99)
Potassium: 3.5 mmol/L (ref 3.5–5.1)
Sodium: 137 mmol/L (ref 135–145)
Total Bilirubin: 0.5 mg/dL (ref 0.0–1.2)
Total Protein: 6.5 g/dL (ref 6.5–8.1)

## 2023-06-23 LAB — MAGNESIUM: Magnesium: 2.2 mg/dL (ref 1.7–2.4)

## 2023-06-23 LAB — PHOSPHORUS: Phosphorus: 2.8 mg/dL (ref 2.5–4.6)

## 2023-06-23 NOTE — Plan of Care (Signed)

## 2023-06-23 NOTE — Progress Notes (Signed)
 PROGRESS NOTE    Marissa Hayes  ZOX:[id number] DOB: June 04, 1955 DOA: 06/20/2023 PCP: Pcp, No  No chief complaint on file.   Hospital Course:  Marissa Hayes is 68 y.o. female with osteoarthritis, hyperlipidemia, insomnia, who presented to the ED with acute onset fever, urinary frequency and urgency, left leg swelling and erythema.  Patient was admitted for management of sepsis secondary to UTI and left lower extremity cellulitis  Subjective: Rising temperature curve overnight but no true fever.  Tmax 100.2. Patient reports she is feeling much better.  She does endorse that she regularly has night sweats.  Her leg is much less swollen today, still very tender to palpation in the foot, not quite back to baseline.   Objective: Vitals:   06/22/23 1949 06/23/23 0424 06/23/23 0710 06/23/23 0725  BP: (!) 134/54 108/60 (!) 118/54 122/60  Pulse: 86 77 71   Resp: 20 15 16    Temp: 100.2 F (37.9 C) 100.1 F (37.8 C) 98.5 F (36.9 C)   TempSrc:  Oral Oral   SpO2: 99% 98% 98%   Weight:      Height:        Intake/Output Summary (Last 24 hours) at 06/23/2023 8119 Last data filed at 06/23/2023 0400 Gross per 24 hour  Intake 100 ml  Output --  Net 100 ml   Filed Weights   06/20/23 1926  Weight: 95.3 kg    Examination: General exam: Appears calm and comfortable, NAD  Respiratory system: No work of breathing, symmetric chest wall expansion Cardiovascular system: S1 & S2 heard, RRR.  Gastrointestinal system: Abdomen is nondistended, soft and nontender.  Neuro: Alert and oriented. No focal neurological deficits. Extremities: Left leg with distal erythema in the distal one third, left foot still significantly swollen and erythematous, Tender to palpation in that area. Psychiatry: Demonstrates appropriate judgement and insight. Mood & affect appropriate for situation.   Assessment & Plan:  Principal Problem:   Acute lower UTI Active Problems:   Left leg cellulitis   Endometrial thickening on  ultrasound   Chronic constipation    Sepsis  -- Secondary to left lower extremity cellulitis - Criteria met on arrival with fever, leukocytosis, hypotension -- Continue antibiotics as below - Continue trend CBC, leukocytosis is downtrending. - Continue trending fever curve.  Some elevated temps overnight but no true fever. - Blood cultures negative to date.  Will continue to follow.  Left leg cellulitis - Was initiated on vancomycin and Rocephin initially.  Vancomycin discontinued on 4/6. - Continue ceftriaxone for now.  Continue close monitoring for resolution. - Of note patient has had prior hospitalization for left leg cellulitis in September 2024.  During that time she required cefazolin and linezolid for propria source control but eventually was transition to [employer]. - Continue with as needed pain medication - Doppler negative for DVT - Continue lower extremity elevation  Venous stasis - Continue leg elevation and compression wraps when able  UTI -Urine culture with polymicrobial growth.  On ceftriaxone for now which should be providing adequate coverage. - CT abdomen pelvis negative for urinary tract findings.  Endometrial thickening on ultrasound - Endometrial thickness of 32.1 mm. - Inguinal lymph node enlargement incidentally seen on CT - Patient also endorses frequent night sweats - Needs outpatient follow-up with gynecology for biopsy.  Will refer at discharge and discussed directly with gynecology  Chronic constipation - Continue with bowel regimen  Small hiatal hernia - Incidentally seen on CT - As needed Pepcid  Sigmoid and  descending colon diverticulosis - Continue bowel regimen  Left inguinal lymph node enlargement - Some nodes measuring 14 mm with surrounding inflammatory stranding as well as numerous prominent left external iliac chain lymph nodes  Osteoarthritis Severe degenerative changes of both hips seen on CT. - PT/OT - Pain management as  needed  Hyperlipidemia - Patient currently working on diet control - Follow-up with PCP  DVT prophylaxis: lovenox   Code Status: Full Code Family Communication:  Discussed directly with patient Disposition: Inpatient, still hospitalized for IV antibiotics, pending source control and fever resolution.  Will likely discharge home tomorrow Consultants:    Procedures:    Antimicrobials:  Anti-infectives (From admission, onward)    Start     Dose/Rate Route Frequency Ordered Stop   06/22/23 1600  vancomycin (VANCOREADY) IVPB 1250 mg/250 mL  Status:  Discontinued        1,250 mg 166.7 mL/hr over 90 Minutes Intravenous Every 24 hours 06/21/23 1425 06/22/23 0856   06/21/23 2200  cefTRIAXone (ROCEPHIN) 2 g in sodium chloride 0.9 % 100 mL IVPB        2 g 200 mL/hr over 30 Minutes Intravenous Every 24 hours 06/21/23 0539     06/21/23 1415  vancomycin (VANCOREADY) IVPB 2000 mg/400 mL        2,000 mg 200 mL/hr over 120 Minutes Intravenous  Once 06/21/23 1403 06/22/23 1052   06/21/23 0600  ceFAZolin (ANCEF) IVPB 2g/100 mL premix  Status:  Discontinued        2 g 200 mL/hr over 30 Minutes Intravenous Every 8 hours 06/21/23 0139 06/21/23 0539   06/20/23 2345  cefTRIAXone (ROCEPHIN) 2 g in sodium chloride 0.9 % 100 mL IVPB        2 g 200 mL/hr over 30 Minutes Intravenous Once 06/20/23 2330 06/21/23 0033       Data Reviewed: I have personally reviewed following labs and imaging studies CBC: Recent Labs  Lab 06/20/23 1934 06/21/23 0710 06/22/23 0459 06/23/23 0510  WBC 18.4* 17.0* 13.9* 11.8*  NEUTROABS  --   --   --  9.2*  HGB 13.2 12.9 12.0 10.9*  HCT 38.9 39.8 36.5 32.5*  MCV 89.4 92.1 91.5 89.0  PLT 185 121* 163 177   Basic Metabolic Panel: Recent Labs  Lab 06/20/23 1934 06/21/23 0710 06/22/23 0459 06/23/23 0510  NA 136 137 136 137  K 3.8 3.7 4.0 3.5  CL 102 103 108 106  CO2 22 24 22 24   GLUCOSE 121* 84 96 108*  BUN 16 18 12 11   CREATININE 1.23* 1.18* 1.09* 0.93   CALCIUM 8.5* 8.4* 8.2* 8.2*  MG  --   --   --  2.2  PHOS  --   --   --  2.8   GFR: Estimated Creatinine Clearance: 65.5 mL/min (by C-G formula based on SCr of 0.93 mg/dL). Liver Function Tests: Recent Labs  Lab 06/20/23 1934 06/23/23 0510  AST 53* 35  ALT 28 23  ALKPHOS 64 87  BILITOT 1.0 0.5  PROT 7.4 6.5  ALBUMIN 3.5 2.5*   CBG: No results for input(s): "GLUCAP" in the last 168 hours.  Recent Results (from the past 240 hours)  Urine Culture (for pregnant, neutropenic or urologic patients or patients with an indwelling urinary catheter)     Status: Abnormal   Collection Time: 06/20/23  7:34 PM   Specimen: Urine, Catheterized  Result Value Ref Range Status   Specimen Description   Final    URINE, CATHETERIZED Performed  at [facility], [address], [place], [place] [address]    Special Requests   Final    NONE Performed at [facility], [address] [place], [place] [address]    Culture MULTIPLE SPECIES PRESENT, SUGGEST RECOLLECTION (A)  Final   Report Status 06/22/2023 FINAL  Final  Blood Culture (routine x 2)     Status: None (Preliminary result)   Collection Time: 06/20/23 11:16 PM   Specimen: BLOOD  Result Value Ref Range Status   Specimen Description BLOOD RIGHT ASSIST CONTROL  Final   Special Requests   Final    BOTTLES DRAWN AEROBIC AND ANAEROBIC Blood Culture results may not be optimal due to an inadequate volume of blood received in culture bottles   Culture   Final    NO GROWTH 2 DAYS Performed at [facility], [address], [place], [place] [address]    Report Status PENDING  Incomplete  Blood Culture (routine x 2)     Status: None (Preliminary result)   Collection Time: 06/21/23 12:31 AM   Specimen: BLOOD  Result Value Ref Range Status   Specimen Description BLOOD BLOOD RIGHT HAND  Final   Special Requests   Final    BOTTLES DRAWN AEROBIC ONLY Blood Culture results may not be optimal due to an inadequate volume  of blood received in culture bottles   Culture   Final    NO GROWTH 2 DAYS Performed at [facility], [address], [place], [place] [address]    Report Status PENDING  Incomplete     Radiology Studies: No results found.   Scheduled Meds:  enoxaparin (LOVENOX) injection  50 mg Subcutaneous Q24H   polyethylene glycol  17 g Oral Daily   potassium chloride SA  40 mEq Oral Daily   Continuous Infusions:  cefTRIAXone (ROCEPHIN)  IV 2 g (06/22/23 2106)     LOS: 3 days  MDM: Patient is high risk for one or more organ failure.  They necessitate ongoing hospitalization for continued IV therapies and subsequent lab monitoring. Total time spent interpreting labs and vitals, coordinating care amongst consultants and care team members, directly assessing and discussing care with the patient and/or family: 55 min    Debarah Crape, DO [facility]  To contact the attending physician between 7A-7P please use Epic Chat. To contact the covering physician during after hours 7P-7A, please review Amion.   06/23/2023, 8:42 AM   *This document has been created with the assistance of dictation software. Please excuse typographical errors. *

## 2023-06-23 NOTE — Plan of Care (Signed)

## 2023-06-23 NOTE — Care Management Important Message (Signed)
 Important Message  Patient Details  Name: Marissa Hayes MRN: [id number] Date of Birth: February 02, 1956   Important Message Given:  Yes - Medicare IM     Cristela Blue, CMA 06/23/2023, 10:32 AM

## 2023-06-24 DIAGNOSIS — N390 Urinary tract infection, site not specified: Secondary | ICD-10-CM | POA: Diagnosis not present

## 2023-06-24 DIAGNOSIS — R9389 Abnormal findings on diagnostic imaging of other specified body structures: Secondary | ICD-10-CM | POA: Diagnosis not present

## 2023-06-24 DIAGNOSIS — L03116 Cellulitis of left lower limb: Secondary | ICD-10-CM | POA: Diagnosis not present

## 2023-06-24 DIAGNOSIS — K5909 Other constipation: Secondary | ICD-10-CM | POA: Diagnosis not present

## 2023-06-24 LAB — COMPREHENSIVE METABOLIC PANEL WITH GFR
ALT: 26 U/L (ref 0–44)
AST: 34 U/L (ref 15–41)
Albumin: 2.6 g/dL — ABNORMAL LOW (ref 3.5–5.0)
Alkaline Phosphatase: 87 U/L (ref 38–126)
Anion gap: 6 (ref 5–15)
BUN: 10 mg/dL (ref 8–23)
CO2: 27 mmol/L (ref 22–32)
Calcium: 8.4 mg/dL — ABNORMAL LOW (ref 8.9–10.3)
Chloride: 104 mmol/L (ref 98–111)
Creatinine, Ser: 0.84 mg/dL (ref 0.44–1.00)
GFR, Estimated: >60 mL/min (ref >60)
Glucose, Bld: 106 mg/dL — ABNORMAL HIGH (ref 70–99)
Potassium: 3.5 mmol/L (ref 3.5–5.1)
Sodium: 137 mmol/L (ref 135–145)
Total Bilirubin: 0.3 mg/dL (ref 0.0–1.2)
Total Protein: 6.3 g/dL — ABNORMAL LOW (ref 6.5–8.1)

## 2023-06-24 LAB — CBC WITH DIFFERENTIAL/PLATELET
Abs Immature Granulocytes: 0.06 10*3/uL (ref 0.00–0.07)
Basophils Absolute: 0.1 10*3/uL (ref 0.0–0.1)
Basophils Relative: 1 %
Eosinophils Absolute: 0.2 10*3/uL (ref 0.0–0.5)
Eosinophils Relative: 2 %
HCT: 32.7 % — ABNORMAL LOW (ref 36.0–46.0)
Hemoglobin: 11 g/dL — ABNORMAL LOW (ref 12.0–15.0)
Immature Granulocytes: 1 %
Lymphocytes Relative: 17 %
Lymphs Abs: 1.7 10*3/uL (ref 0.7–4.0)
MCH: 30.2 pg (ref 26.0–34.0)
MCHC: 33.6 g/dL (ref 30.0–36.0)
MCV: 89.8 fL (ref 80.0–100.0)
Monocytes Absolute: 1 10*3/uL (ref 0.1–1.0)
Monocytes Relative: 9 %
Neutro Abs: 7.1 10*3/uL (ref 1.7–7.7)
Neutrophils Relative %: 70 %
Platelets: 193 10*3/uL (ref 150–400)
RBC: 3.64 MIL/uL — ABNORMAL LOW (ref 3.87–5.11)
RDW: 13.5 % (ref 11.5–15.5)
WBC: 10.1 10*3/uL (ref 4.0–10.5)
nRBC: 0 % (ref 0.0–0.2)

## 2023-06-24 LAB — PHOSPHORUS: Phosphorus: 3.4 mg/dL (ref 2.5–4.6)

## 2023-06-24 LAB — MAGNESIUM: Magnesium: 2.1 mg/dL (ref 1.7–2.4)

## 2023-06-24 MED ORDER — CEPHALEXIN 500 MG PO CAPS: 500.0000 mg | ORAL_CAPSULE | Freq: Two times a day (BID) | ORAL | 0 refills | Status: AC

## 2023-06-24 NOTE — Discharge Summary (Signed)
 Physician Discharge Summary   Patient: Marissa Hayes MRN: [id number] DOB: 17-Aug-1955  Admit date:     06/20/2023  Discharge date: 06/24/23  Discharge Physician: Debarah Crape   PCP: Pcp, No   Recommendations at discharge:   Follow-up with gynecology outpatient for endometrial biopsy Follow-up with primary care to ensure resolution of cellulitis  Discharge Diagnoses: Principal Problem:   Acute lower UTI Active Problems:   Left leg cellulitis   Endometrial thickening on ultrasound   Chronic constipation  Resolved Problems:   * No resolved hospital problems. *  Hospital Course: Marissa Hayes is 68 y.o. female with osteoarthritis, hyperlipidemia, insomnia, who presented to the ED with acute onset fever, urinary frequency and urgency, left leg swelling and erythema.  Patient was admitted for management of sepsis secondary to left lower extremity cellulitis.  She was also noted to have a UTI.  Patient's infection was slow to resolve but as of 4/8 she was able to ambulate and erythema had resolved.  She was anxious to return home.  She still has some lower extremity edema some of which is chronic.  She will need to continue taking antibiotics for 7 days to complete the course.  She is following up with her primary care physician to ensure resolution of infection. Stay was further complicated by endometrial thickening which was incidentally seen on CT and confirmed on ultrasound.  She was also found have inguinal lobe enlargement.  I discussed this extensively with her.  She endorses understanding.  She will need gynecology follow-up for endometrial biopsy outpatient.  Referral has been placed at discharge.    Sepsis  -- Secondary to left lower extremity cellulitis - Criteria met on arrival with fever, leukocytosis, hypotension - Antibiotics as below - Leukocytosis has resolved - No fever x 48 hours. - Complete antibiotic course outpatient - Blood cultures negative to date.  Will continue to  follow.   Left leg cellulitis - Was initiated on vancomycin and Rocephin initially.  Vancomycin discontinued on 4/6. - Has had improvement on just ceftriaxone.  Will further de-escalate to Keflex at discharge.  Continue for 7 days - Follow-up with primary care physician to ensure resolution. - Of note patient has had prior hospitalization for left leg cellulitis in September 2024.  During that time she required cefazolin and linezolid for propria source control but eventually was transition to [employer]. - Doppler negative for DVT - Continue lower extremity elevation   Venous stasis - Continue leg elevation and compression wraps when able   UTI -Urine culture with polymicrobial growth.   -Has received 3 days ceftriaxone. - CT abdomen pelvis negative for urinary tract findings.   Endometrial thickening on ultrasound - Endometrial thickness of 32.1 mm. - Inguinal lymph node enlargement incidentally seen on CT - Patient also endorses frequent night sweats - Needs outpatient follow-up with gynecology for biopsy.  Has been referred at discharge.  Discussed with her extensively  Chronic constipation - Continue with bowel regimen   Small hiatal hernia - Incidentally seen on CT - As needed Pepcid   Sigmoid and descending colon diverticulosis - Continue bowel regimen   Left inguinal lymph node enlargement - Some nodes measuring 14 mm with surrounding inflammatory stranding as well as numerous prominent left external iliac chain lymph nodes   Osteoarthritis Severe degenerative changes of both hips seen on CT. - PT/OT - Pain management as needed   Hyperlipidemia - Patient currently working on diet control - Follow-up with PCP  Disposition: Home Diet  recommendation:  Discharge Diet Orders (From admission, onward)     Start     Ordered   06/24/23 0000  Diet general        06/24/23 1156           Regular diet DISCHARGE MEDICATION: Allergies as of 06/24/2023   No Known  Allergies      Medication List     STOP taking these medications    docusate sodium 100 MG capsule Commonly known as: COLACE   senna 8.6 MG Tabs tablet Commonly known as: SENOKOT       TAKE these medications    cephALEXin 500 MG capsule Commonly known as: KEFLEX Take 1 capsule (500 mg total) by mouth in the morning and at bedtime for 7 days.   polyethylene glycol 17 g packet Commonly known as: MIRALAX / GLYCOLAX Take 17 g by mouth daily.   potassium chloride SA 20 MEQ tablet Commonly known as: KLOR-CON M Take 2 tablets (40 mEq total) by mouth daily.        Discharge Exam: Filed Weights   06/20/23 1926  Weight: 95.3 kg   Constitutional:  Normal appearance. Non toxic-appearing.  HENT: Head Normocephalic and atraumatic.  Mucous membranes are moist.  Eyes:  Extraocular intact. Conjunctivae normal. Pupils are equal, round, and reactive to light.  Cardiovascular: Rate and Rhythm: Normal rate and regular rhythm.  Pulmonary: Non labored, symmetric rise of chest wall.  Musculoskeletal: Left lower extremity still with some swelling, erythema has resolved, some mild erythema at the most distal aspect of foot.  Minimal tenderness to palpation Neurological: No focal deficit present. alert. Oriented. Psychiatric: Mood and Affect congruent.     Discharge Instructions     Ambulatory referral to Gynecology   Complete by: As directed    Endometrial biopsy   Call MD for:  difficulty breathing, headache or visual disturbances   Complete by: As directed    Call MD for:  persistant dizziness or light-headedness   Complete by: As directed    Call MD for:  persistant nausea and vomiting   Complete by: As directed    Call MD for:  severe uncontrolled pain   Complete by: As directed    Call MD for:  temperature >100.4   Complete by: As directed    Diet general   Complete by: As directed    Discharge instructions   Complete by: As directed    Please follow-up with your  primary care physician to check your infection has healed completely.  Please complete this course of antibiotics as prescribed   Increase activity slowly   Complete by: As directed          Condition at discharge: stable  The results of significant diagnostics from this hospitalization (including imaging, microbiology, ancillary and laboratory) are listed below for reference.   Imaging Studies: US Pelvis Complete Result Date: 06/21/2023 CLINICAL DATA:  Endometrial thickening on CT EXAM: TRANSABDOMINAL ULTRASOUND OF PELVIS TECHNIQUE: Transabdominal ultrasound examination of the pelvis was performed including evaluation of the uterus, ovaries, adnexal regions, and pelvic cul-de-sac. COMPARISON:  CT 06/20/2023 FINDINGS: Uterus Measurements: 10.2 x 6.2 x 6.5 cm = volume: 217.5 mL. No fibroids or other mass visualized. Endometrium Thickness: 32.1.  No focal abnormality visualized. Right ovary Not seen Left ovary Not seen Other findings:  No abnormal free fluid. IMPRESSION: 1. Endometrial thickness of 32.1 mm. Endometrial thickness is considered abnormal for an asymptomatic post-menopausal female. Endometrial sampling should be considered to exclude carcinoma. 2.  Nonvisualized ovaries Electronically Signed   By: Jasmine Pang M.D.   On: 06/21/2023 00:09   US Venous Img Lower  Left (DVT Study) Result Date: 06/20/2023 CLINICAL DATA:  Left lower extremity swelling and redness. EXAM: LEFT LOWER EXTREMITY VENOUS DOPPLER ULTRASOUND TECHNIQUE: Gray-scale sonography with graded compression, as well as color Doppler and duplex ultrasound were performed to evaluate the lower extremity deep venous systems from the level of the common femoral vein and including the common femoral, femoral, profunda femoral, popliteal and calf veins including the posterior tibial, peroneal and gastrocnemius veins when visible. The superficial great saphenous vein was also interrogated. Spectral Doppler was utilized to evaluate flow at  rest and with distal augmentation maneuvers in the common femoral, femoral and popliteal veins. COMPARISON:  None Available. FINDINGS: Contralateral Common Femoral Vein: Respiratory phasicity is normal and symmetric with the symptomatic side. No evidence of thrombus. Normal compressibility. Common Femoral Vein: No evidence of thrombus. Normal compressibility, respiratory phasicity and response to augmentation. Saphenofemoral Junction: No evidence of thrombus. Normal compressibility and flow on color Doppler imaging. Profunda Femoral Vein: No evidence of thrombus. Normal compressibility and flow on color Doppler imaging. Femoral Vein: No evidence of thrombus. Normal compressibility, respiratory phasicity and response to augmentation. Popliteal Vein: No evidence of thrombus. Normal compressibility, respiratory phasicity and response to augmentation. Calf Veins: No evidence of thrombus. Normal compressibility and flow on color Doppler imaging. Superficial Great Saphenous Vein: No evidence of thrombus. Normal compressibility. Venous Reflux:  None. Other Findings:  None. IMPRESSION: No evidence of deep venous thrombosis. Electronically Signed   By: Aram Candela M.D.   On: 06/20/2023 23:01   CT ABDOMEN PELVIS WO CONTRAST Result Date: 06/20/2023 CLINICAL DATA:  Fever and chills. Acute abdominal pain. Urinary incontinence. EXAM: CT ABDOMEN AND PELVIS WITHOUT CONTRAST TECHNIQUE: Multidetector CT imaging of the abdomen and pelvis was performed following the standard protocol without IV contrast. RADIATION DOSE REDUCTION: This exam was performed according to the departmental dose-optimization program which includes automated exposure control, adjustment of the mA and/or kV according to patient size and/or use of iterative reconstruction technique. COMPARISON:  None FINDINGS: Lower chest: No acute abnormality. Hepatobiliary: No focal liver abnormality is seen. No gallstones, gallbladder wall thickening, or biliary  dilatation. Pancreas: Unremarkable. No pancreatic ductal dilatation or surrounding inflammatory changes. Spleen: Normal in size without focal abnormality. Adrenals/Urinary Tract: Adrenal glands are unremarkable. Kidneys are normal, without renal calculi, focal lesion, or hydronephrosis. Bladder is unremarkable. Stomach/Bowel: There is a small hiatal hernia. Stomach is within normal limits. Appendix appears normal. No evidence of bowel wall thickening, distention, or inflammatory changes. There is sigmoid and descending colon diverticulosis. Vascular/Lymphatic: Aorta and IVC are normal in size. There are nonenlarged and enlarged left inguinal lymph nodes measuring up to 14 mm with surrounding inflammatory stranding. There are numerous prominent left external iliac chain lymph nodes. There is 1 mildly enlarged left external iliac chain lymph node measuring 10 mm. Reproductive: The endometrial cavity appears heterogeneous and thickened measuring up to 2.2 cm. Bilateral ovaries are within normal limits. Other: There is no ascites. There is a small fat containing umbilical hernia. 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 IMPRESSION: 1. No evidence for urinary tract calculus or obstruction. 2. Thickened heterogeneous endometrium, indeterminate. Recommend further evaluation with pelvic ultrasound. 3. Left iliac chain and inguinal lymphadenopathy of uncertain etiology. The Electronically Signed   By: Darliss Cheney M.D.   On: 06/20/2023 22:19    Microbiology: Results for orders placed or performed  during the hospital encounter of 06/20/23  Urine Culture (for pregnant, neutropenic or urologic patients or patients with an indwelling urinary catheter)     Status: Abnormal   Collection Time: 06/20/23  7:34 PM   Specimen: Urine, Catheterized  Result Value Ref Range Status   Specimen Description   Final    URINE, CATHETERIZED Performed at [facility], [address]  [place], [place] [address]    Special Requests   Final    NONE Performed at [facility], [address] [place], [place] [address]    Culture MULTIPLE SPECIES PRESENT, SUGGEST RECOLLECTION (A)  Final   Report Status 06/22/2023 FINAL  Final  Blood Culture (routine x 2)     Status: None (Preliminary result)   Collection Time: 06/20/23 11:16 PM   Specimen: BLOOD  Result Value Ref Range Status   Specimen Description BLOOD RIGHT ASSIST CONTROL  Final   Special Requests   Final    BOTTLES DRAWN AEROBIC AND ANAEROBIC Blood Culture results may not be optimal due to an inadequate volume of blood received in culture bottles   Culture   Final    NO GROWTH 3 DAYS Performed at [facility], [address] [place], [place] [address]    Report Status PENDING  Incomplete  Blood Culture (routine x 2)     Status: None (Preliminary result)   Collection Time: 06/21/23 12:31 AM   Specimen: BLOOD  Result Value Ref Range Status   Specimen Description BLOOD BLOOD RIGHT HAND  Final   Special Requests   Final    BOTTLES DRAWN AEROBIC ONLY Blood Culture results may not be optimal due to an inadequate volume of blood received in culture bottles   Culture   Final    NO GROWTH 3 DAYS Performed at [facility], [address] [place], [place] [address]    Report Status PENDING  Incomplete    Labs: CBC: Recent Labs  Lab 06/20/23 1934 06/21/23 0710 06/22/23 0459 06/23/23 0510 06/24/23 0457  WBC 18.4* 17.0* 13.9* 11.8* 10.1  NEUTROABS  --   --   --  9.2* 7.1  HGB 13.2 12.9 12.0 10.9* 11.0*  HCT 38.9 39.8 36.5 32.5* 32.7*  MCV 89.4 92.1 91.5 89.0 89.8  PLT 185 121* 163 177 193   Basic Metabolic Panel: Recent Labs  Lab 06/20/23 1934 06/21/23 0710 06/22/23 0459 06/23/23 0510 06/24/23 0457  NA 136 137 136 137 137  K 3.8 3.7 4.0 3.5 3.5  CL 102 103 108 106 104  CO2 22 24 22 24 27   GLUCOSE 121* 84 96 108* 106*  BUN 16 18 12 11 10   CREATININE 1.23* 1.18* 1.09* 0.93  0.84  CALCIUM 8.5* 8.4* 8.2* 8.2* 8.4*  MG  --   --   --  2.2 2.1  PHOS  --   --   --  2.8 3.4   Liver Function Tests: Recent Labs  Lab 06/20/23 1934 06/23/23 0510 06/24/23 0457  AST 53* 35 34  ALT 28 23 26   ALKPHOS 64 87 87  BILITOT 1.0 0.5 0.3  PROT 7.4 6.5 6.3*  ALBUMIN 3.5 2.5* 2.6*   CBG: No results for input(s): "GLUCAP" in the last 168 hours.  Discharge time spent: 31 minutes.  Signed: Debarah Crape, DO [facility] 06/24/2023

## 2023-06-24 NOTE — Hospital Course (Signed)
 Marissa Hayes is 68 y.o. female with osteoarthritis, hyperlipidemia, insomnia, who presented to the ED with acute onset fever, urinary frequency and urgency, left leg swelling and erythema.  Patient was admitted for management of sepsis secondary to left lower extremity cellulitis.  She was also noted to have a UTI.  Patient's infection was slow to resolve but as of 4/8 she was able to ambulate and erythema had resolved.  She was anxious to return home.  She still has some lower extremity edema some of which is chronic.  She will need to continue taking antibiotics for 7 days to complete the course.  She is following up with her primary care physician to ensure resolution of infection. Stay was further complicated by endometrial thickening which was incidentally seen on CT and confirmed on ultrasound.  She was also found have inguinal lobe enlargement.  I discussed this extensively with her.  She endorses understanding.  She will need gynecology follow-up for endometrial biopsy outpatient.  Referral has been placed at discharge.    Sepsis  -- Secondary to left lower extremity cellulitis - Criteria met on arrival with fever, leukocytosis, hypotension - Antibiotics as below - Leukocytosis has resolved - No fever x 48 hours. - Complete antibiotic course outpatient - Blood cultures negative to date.  Will continue to follow.   Left leg cellulitis - Was initiated on vancomycin and Rocephin initially.  Vancomycin discontinued on 4/6. - Has had improvement on just ceftriaxone.  Will further de-escalate to Keflex at discharge.  Continue for 7 days - Follow-up with primary care physician to ensure resolution. - Of note patient has had prior hospitalization for left leg cellulitis in September 2024.  During that time she required cefazolin and linezolid for propria source control but eventually was transition to [employer]. - Doppler negative for DVT - Continue lower extremity elevation   Venous stasis -  Continue leg elevation and compression wraps when able   UTI -Urine culture with polymicrobial growth.   -Has received 3 days ceftriaxone. - CT abdomen pelvis negative for urinary tract findings.   Endometrial thickening on ultrasound - Endometrial thickness of 32.1 mm. - Inguinal lymph node enlargement incidentally seen on CT - Patient also endorses frequent night sweats - Needs outpatient follow-up with gynecology for biopsy.  Has been referred at discharge.  Discussed with her extensively  Chronic constipation - Continue with bowel regimen   Small hiatal hernia - Incidentally seen on CT - As needed Pepcid   Sigmoid and descending colon diverticulosis - Continue bowel regimen   Left inguinal lymph node enlargement - Some nodes measuring 14 mm with surrounding inflammatory stranding as well as numerous prominent left external iliac chain lymph nodes   Osteoarthritis Severe degenerative changes of both hips seen on CT. - PT/OT - Pain management as needed   Hyperlipidemia - Patient currently working on diet control - Follow-up with PCP

## 2023-06-26 DIAGNOSIS — I83893 Varicose veins of bilateral lower extremities with other complications: Secondary | ICD-10-CM | POA: Diagnosis not present

## 2023-06-26 LAB — CULTURE, BLOOD (ROUTINE X 2): Culture: NO GROWTH

## 2023-06-27 LAB — CULTURE, BLOOD (ROUTINE X 2): Culture: NO GROWTH

## 2023-07-07 DIAGNOSIS — L03116 Cellulitis of left lower limb: Secondary | ICD-10-CM | POA: Diagnosis not present

## 2023-07-29 ENCOUNTER — Encounter: Admitting: Obstetrics & Gynecology

## 2023-07-31 ENCOUNTER — Encounter: Payer: Self-pay | Admitting: Obstetrics & Gynecology
